# Patient Record
Sex: Female | Born: 1980
Health system: Southern US, Community
[De-identification: ages and names within clinical notes are randomized; demographics above are authoritative.]

## PROBLEM LIST (undated history)

## (undated) DIAGNOSIS — R079 Chest pain, unspecified: Secondary | ICD-10-CM

## (undated) DIAGNOSIS — F419 Anxiety disorder, unspecified: Secondary | ICD-10-CM

## (undated) DIAGNOSIS — G43709 Chronic migraine without aura, not intractable, without status migrainosus: Secondary | ICD-10-CM

## (undated) DIAGNOSIS — Z973 Presence of spectacles and contact lenses: Secondary | ICD-10-CM

## (undated) DIAGNOSIS — Z8679 Personal history of other diseases of the circulatory system: Secondary | ICD-10-CM

## (undated) DIAGNOSIS — R519 Headache, unspecified: Secondary | ICD-10-CM

## (undated) DIAGNOSIS — M25569 Pain in unspecified knee: Secondary | ICD-10-CM

## (undated) DIAGNOSIS — IMO0002 Reserved for concepts with insufficient information to code with codable children: Secondary | ICD-10-CM

## (undated) DIAGNOSIS — G479 Sleep disorder, unspecified: Secondary | ICD-10-CM

## (undated) DIAGNOSIS — F32A Depression, unspecified: Secondary | ICD-10-CM

## (undated) DIAGNOSIS — N3010 Interstitial cystitis (chronic) without hematuria: Secondary | ICD-10-CM

## (undated) DIAGNOSIS — M549 Dorsalgia, unspecified: Secondary | ICD-10-CM

## (undated) DIAGNOSIS — R0602 Shortness of breath: Secondary | ICD-10-CM

## (undated) DIAGNOSIS — R51 Headache: Secondary | ICD-10-CM

## (undated) DIAGNOSIS — Z87442 Personal history of urinary calculi: Secondary | ICD-10-CM

## (undated) DIAGNOSIS — N939 Abnormal uterine and vaginal bleeding, unspecified: Secondary | ICD-10-CM

## (undated) DIAGNOSIS — K589 Irritable bowel syndrome without diarrhea: Secondary | ICD-10-CM

## (undated) DIAGNOSIS — K5900 Constipation, unspecified: Secondary | ICD-10-CM

## (undated) DIAGNOSIS — F329 Major depressive disorder, single episode, unspecified: Secondary | ICD-10-CM

## (undated) DIAGNOSIS — M7989 Other specified soft tissue disorders: Secondary | ICD-10-CM

## (undated) DIAGNOSIS — I10 Essential (primary) hypertension: Secondary | ICD-10-CM

## (undated) DIAGNOSIS — M436 Torticollis: Secondary | ICD-10-CM

## (undated) HISTORY — DX: Major depressive disorder, single episode, unspecified: F32.9

## (undated) HISTORY — DX: Interstitial cystitis (chronic) without hematuria: N30.10

## (undated) HISTORY — DX: Shortness of breath: R06.02

## (undated) HISTORY — DX: Sleep disorder, unspecified: G47.9

## (undated) HISTORY — DX: Constipation, unspecified: K59.00

## (undated) HISTORY — PX: BUNIONECTOMY: SHX129

## (undated) HISTORY — DX: Pain in unspecified knee: M25.569

## (undated) HISTORY — DX: Other specified soft tissue disorders: M79.89

## (undated) HISTORY — PX: AUGMENTATION MAMMAPLASTY: SUR837

## (undated) HISTORY — DX: Chest pain, unspecified: R07.9

## (undated) HISTORY — DX: Torticollis: M43.6

## (undated) HISTORY — DX: Anxiety disorder, unspecified: F41.9

## (undated) HISTORY — DX: Depression, unspecified: F32.A

## (undated) HISTORY — PX: BREAST SURGERY: SHX581

## (undated) HISTORY — DX: Dorsalgia, unspecified: M54.9

## (undated) HISTORY — DX: Irritable bowel syndrome without diarrhea: K58.9

## (undated) HISTORY — PX: WISDOM TOOTH EXTRACTION: SHX21

---

## 2000-07-16 ENCOUNTER — Emergency Department (HOSPITAL_COMMUNITY): Admission: EM | Admit: 2000-07-16 | Discharge: 2000-07-16 | Payer: Self-pay | Admitting: Emergency Medicine

## 2002-05-22 ENCOUNTER — Inpatient Hospital Stay (HOSPITAL_COMMUNITY): Admission: AD | Admit: 2002-05-22 | Discharge: 2002-05-25 | Payer: Self-pay | Admitting: *Deleted

## 2003-07-10 ENCOUNTER — Other Ambulatory Visit: Admission: RE | Admit: 2003-07-10 | Discharge: 2003-07-10 | Payer: Self-pay | Admitting: Obstetrics & Gynecology

## 2006-06-22 ENCOUNTER — Emergency Department (HOSPITAL_COMMUNITY): Admission: EM | Admit: 2006-06-22 | Discharge: 2006-06-22 | Payer: Self-pay | Admitting: Emergency Medicine

## 2006-08-24 ENCOUNTER — Emergency Department (HOSPITAL_COMMUNITY): Admission: EM | Admit: 2006-08-24 | Discharge: 2006-08-24 | Payer: Self-pay | Admitting: Emergency Medicine

## 2007-12-04 ENCOUNTER — Emergency Department (HOSPITAL_COMMUNITY): Admission: EM | Admit: 2007-12-04 | Discharge: 2007-12-05 | Payer: Self-pay | Admitting: Emergency Medicine

## 2007-12-05 ENCOUNTER — Other Ambulatory Visit: Admission: RE | Admit: 2007-12-05 | Discharge: 2007-12-05 | Payer: Self-pay | Admitting: Obstetrics and Gynecology

## 2007-12-22 ENCOUNTER — Emergency Department (HOSPITAL_COMMUNITY): Admission: EM | Admit: 2007-12-22 | Discharge: 2007-12-22 | Payer: Self-pay | Admitting: Family Medicine

## 2008-07-09 ENCOUNTER — Emergency Department (HOSPITAL_COMMUNITY): Admission: EM | Admit: 2008-07-09 | Discharge: 2008-07-09 | Payer: Self-pay | Admitting: Emergency Medicine

## 2010-10-12 HISTORY — PX: BREAST ENHANCEMENT SURGERY: SHX7

## 2011-07-03 LAB — I-STAT 8, (EC8 V) (CONVERTED LAB)
BUN: 12
Bicarbonate: 26.5 — ABNORMAL HIGH
Chloride: 104
HCT: 45
Hemoglobin: 15.3 — ABNORMAL HIGH
Operator id: 277751
Sodium: 139
pCO2, Ven: 47.3

## 2011-07-03 LAB — POCT CARDIAC MARKERS: Troponin i, poc: <0.05

## 2011-07-03 LAB — POCT I-STAT CREATININE: Creatinine, Ser: 1

## 2011-07-13 LAB — DIFFERENTIAL
Basophils Absolute: 0.1
Eosinophils Absolute: 0.1
Eosinophils Relative: 1
Lymphocytes Relative: 8 — ABNORMAL LOW
Monocytes Absolute: 0.9

## 2011-07-13 LAB — COMPREHENSIVE METABOLIC PANEL
ALT: 15
AST: 18
Alkaline Phosphatase: 64
CO2: 27
Calcium: 8.7
Chloride: 105
GFR calc non Af Amer: >60
Glucose, Bld: 84
Potassium: 3.4 — ABNORMAL LOW
Sodium: 138
Total Bilirubin: 0.9

## 2011-07-13 LAB — URINE MICROSCOPIC-ADD ON

## 2011-07-13 LAB — CBC
Platelets: 246
RBC: 4.85
WBC: 17.8 — ABNORMAL HIGH

## 2011-07-13 LAB — URINALYSIS, ROUTINE W REFLEX MICROSCOPIC
Bilirubin Urine: NEGATIVE
Ketones, ur: NEGATIVE
Nitrite: NEGATIVE
Specific Gravity, Urine: 1.03
pH: 5.5

## 2011-07-13 LAB — PREGNANCY, URINE

## 2011-10-13 HISTORY — PX: INTRAUTERINE DEVICE (IUD) INSERTION: SHX5877

## 2012-01-18 ENCOUNTER — Ambulatory Visit: Payer: Self-pay | Admitting: Obstetrics and Gynecology

## 2012-02-10 ENCOUNTER — Encounter: Payer: Self-pay | Admitting: Obstetrics and Gynecology

## 2013-04-13 ENCOUNTER — Encounter (HOSPITAL_COMMUNITY): Payer: Self-pay | Admitting: Emergency Medicine

## 2013-04-13 ENCOUNTER — Emergency Department (HOSPITAL_COMMUNITY)
Admission: EM | Admit: 2013-04-13 | Discharge: 2013-04-13 | Disposition: A | Discharge status: Home / Self Care | Payer: Medicaid Other | Source: Home / Self Care | Attending: Emergency Medicine | Admitting: Emergency Medicine

## 2013-04-13 DIAGNOSIS — R350 Frequency of micturition: Secondary | ICD-10-CM

## 2013-04-13 DIAGNOSIS — R8271 Bacteriuria: Secondary | ICD-10-CM

## 2013-04-13 DIAGNOSIS — R82998 Other abnormal findings in urine: Secondary | ICD-10-CM

## 2013-04-13 DIAGNOSIS — G4440 Drug-induced headache, not elsewhere classified, not intractable: Secondary | ICD-10-CM

## 2013-04-13 HISTORY — DX: Headache: R51

## 2013-04-13 HISTORY — DX: Headache, unspecified: R51.9

## 2013-04-13 LAB — POCT I-STAT, CHEM 8
Creatinine, Ser: 0.9 mg/dL (ref 0.50–1.10)
Glucose, Bld: 79 mg/dL (ref 70–99)
HCT: 45 % (ref 36.0–46.0)
Hemoglobin: 15.3 g/dL — ABNORMAL HIGH (ref 12.0–15.0)
Sodium: 143 mEq/L (ref 135–145)
TCO2: 25 mmol/L (ref 0–100)

## 2013-04-13 LAB — POCT URINALYSIS DIP (DEVICE)
Glucose, UA: NEGATIVE mg/dL
Nitrite: NEGATIVE
Protein, ur: NEGATIVE mg/dL
Specific Gravity, Urine: >=1.03 (ref 1.005–1.030)
Urobilinogen, UA: 0.2 mg/dL (ref 0.0–1.0)

## 2013-04-13 LAB — POCT PREGNANCY, URINE: Preg Test, Ur: NEGATIVE

## 2013-04-13 MED ORDER — CIPROFLOXACIN HCL 250 MG PO TABS: 500.0000 mg | ORAL_TABLET | Freq: Two times a day (BID) | ORAL | Status: DC

## 2013-04-13 MED ORDER — CYCLOBENZAPRINE HCL 10 MG PO TABS: 10.0000 mg | ORAL_TABLET | Freq: Two times a day (BID) | ORAL | Status: DC | PRN

## 2013-04-13 MED ORDER — AMITRIPTYLINE HCL 25 MG PO TABS: 25.0000 mg | ORAL_TABLET | Freq: Every day | ORAL | Status: DC

## 2013-04-13 NOTE — ED Provider Notes (Signed)
History    CSN: [id number] Arrival date & time 04/13/13  1309  First MD Initiated Contact with Patient 04/13/13 1402     Chief Complaint  Patient presents with  . Headache   (Consider location/radiation/quality/duration/timing/severity/associated sxs/prior Treatment) HPI Comments: 32 year old patient presents complaining of headaches and frequency. Headaches have been going on for 2-3 years and the urinary frequency has been going on for about 6 months. She just recently got Medicaid and called her primary care provider, or health Department, to get an appointment. They would not be able to see her for some time and sent her here for treatment instead.   For the headaches, She has been taking 800 mg ibuprofen about 4-5 times per week along with an over-the-counter sleep aid. They're described as bilateral across the top of her head and are associated with nausea, photophobia, blurry vision. They have not changed significantly since they began a couple of years ago. Her mom suffered from migraines for many years that were similar to the headaches she is experiencing.  She describes the urinary frequency as feeling as if she has to urinate about every 5 minutes. She also states she gets up about 5 times per night to urinate. She tries to not drink any water in hopes of not having to urinate so frequently but this does not work. She denies burning, flank pain, abdominal pain.  She also complains of dry mouth  Patient is a 32 y.o. female presenting with headaches.  Headache Associated symptoms: no abdominal pain, no cough, no dizziness, no fever, no myalgias, no nausea and no vomiting    Past Medical History  Diagnosis Date  . Headache    Past Surgical History  Procedure Laterality Date  . Foot surgery    . Breast surgery     No family history on file. History  Substance Use Topics  . Smoking status: Never Smoker   . Smokeless tobacco: Not on file  . Alcohol Use: No   OB History    Grav Para Term Preterm Abortions TAB SAB Ect Mult Living                 Review of Systems  Constitutional: Negative for fever and chills.  Eyes: Negative for visual disturbance.  Respiratory: Negative for cough and shortness of breath.   Cardiovascular: Negative for chest pain, palpitations and leg swelling.  Gastrointestinal: Negative for nausea, vomiting and abdominal pain.  Endocrine: Negative for polydipsia and polyuria.  Genitourinary: Positive for frequency. Negative for dysuria, urgency, hematuria, flank pain, vaginal discharge and dyspareunia.  Musculoskeletal: Negative for myalgias and arthralgias.  Skin: Negative for rash.  Neurological: Positive for headaches. Negative for dizziness, weakness and light-headedness.    Allergies  Review of patient's allergies indicates no known allergies.  Home Medications   Current Outpatient Rx  Name  Route  Sig  Dispense  Refill  . OVER THE COUNTER MEDICATION      Taking an antibiotic for bv currently, cannot remember the name of drug         . amitriptyline (ELAVIL) 25 MG tablet   Oral   Take 1 tablet (25 mg total) by mouth at bedtime.   30 tablet   2   . ciprofloxacin (CIPRO) 250 MG tablet   Oral   Take 2 tablets (500 mg total) by mouth 2 (two) times daily.   20 tablet   0   . cyclobenzaprine (FLEXERIL) 10 MG tablet   Oral   Take  1 tablet (10 mg total) by mouth 2 (two) times daily as needed.   30 tablet   1    BP 120/67  Pulse 68  Temp(Src) 98.6 F (37 C) (Oral)  Resp 16  SpO2 100% Physical Exam  Nursing note and vitals reviewed. Constitutional: She is oriented to person, place, and time. Vital signs are normal. She appears well-developed and well-nourished. No distress.  HENT:  Head: Atraumatic.  Eyes: EOM are normal. Pupils are equal, round, and reactive to light.  Cardiovascular: Normal rate, regular rhythm and normal heart sounds.  Exam reveals no gallop and no friction rub.   No murmur  heard. Pulmonary/Chest: Effort normal and breath sounds normal. No respiratory distress. She has no wheezes. She has no rales.  Abdominal: Soft. There is no tenderness.  Neurological: She is alert and oriented to person, place, and time. She has normal strength. No cranial nerve deficit. Coordination normal.  Skin: Skin is warm and dry. She is not diaphoretic.  Psychiatric: She has a normal mood and affect. Her behavior is normal. Judgment normal.    ED Course  Procedures (including critical care time) Labs Reviewed  POCT URINALYSIS DIP (DEVICE) - Abnormal; Notable for the following:    Leukocytes, UA SMALL (*)    All other components within normal limits  POCT I-STAT, CHEM 8 - Abnormal; Notable for the following:    Potassium 3.4 (*)    Hemoglobin 15.3 (*)    All other components within normal limits  URINE CULTURE  POCT PREGNANCY, URINE   No results found. 1. Medication overuse headache   2. Urinary frequency   3. Bacteriuria     MDM  There is a small amount of bacteriuria but no definite urinary tract infection. Still, with the urinary frequency, will treat this as a UTI. I suspect the headache is a medication overuse headache. We'll treat as such and she is advised to avoid ibuprofen for headaches completely for the time being, and to followup with her PCP on this issue   Meds ordered this encounter  Medications  . OVER THE COUNTER MEDICATION    Sig: Taking an antibiotic for bv currently, cannot remember the name of drug  . amitriptyline (ELAVIL) 25 MG tablet    Sig: Take 1 tablet (25 mg total) by mouth at bedtime.    Dispense:  30 tablet    Refill:  2  . cyclobenzaprine (FLEXERIL) 10 MG tablet    Sig: Take 1 tablet (10 mg total) by mouth 2 (two) times daily as needed.    Dispense:  30 tablet    Refill:  1  . ciprofloxacin (CIPRO) 250 MG tablet    Sig: Take 2 tablets (500 mg total) by mouth 2 (two) times daily.    Dispense:  20 tablet    Refill:  0     Graylon Good, PA-C 04/13/13 1536

## 2013-04-13 NOTE — ED Provider Notes (Signed)
Medical screening examination/treatment/procedure(s) were performed by non-physician practitioner and as supervising physician I was immediately available for consultation/collaboration.  Madeleyn Schwimmer, M.D.  Arbor Cohen C Tyreshia Ingman, MD 04/13/13 1944 

## 2013-04-13 NOTE — ED Notes (Signed)
Patient reports health department on medicaid card-telephoned them and was told to come to urgent care for evaluation

## 2013-04-13 NOTE — ED Notes (Signed)
C/o headaches and frequent urination, no pain with urination.  Headaches have been an issue for 2-3 years.  Reports bladder issues for 6 months to a year.

## 2013-04-14 LAB — URINE CULTURE

## 2013-06-13 ENCOUNTER — Ambulatory Visit (INDEPENDENT_AMBULATORY_CARE_PROVIDER_SITE_OTHER): Payer: Medicaid Other | Admitting: Nurse Practitioner

## 2013-06-13 ENCOUNTER — Encounter: Payer: Self-pay | Admitting: Nurse Practitioner

## 2013-06-13 VITALS — BP 120/79 | HR 74 | Ht 64.0 in | Wt 142.0 lb

## 2013-06-13 DIAGNOSIS — G43009 Migraine without aura, not intractable, without status migrainosus: Secondary | ICD-10-CM

## 2013-06-13 DIAGNOSIS — F419 Anxiety disorder, unspecified: Secondary | ICD-10-CM | POA: Insufficient documentation

## 2013-06-13 DIAGNOSIS — F329 Major depressive disorder, single episode, unspecified: Secondary | ICD-10-CM | POA: Insufficient documentation

## 2013-06-13 DIAGNOSIS — G4700 Insomnia, unspecified: Secondary | ICD-10-CM | POA: Insufficient documentation

## 2013-06-13 DIAGNOSIS — F411 Generalized anxiety disorder: Secondary | ICD-10-CM

## 2013-06-13 DIAGNOSIS — G43709 Chronic migraine without aura, not intractable, without status migrainosus: Secondary | ICD-10-CM | POA: Insufficient documentation

## 2013-06-13 DIAGNOSIS — IMO0002 Reserved for concepts with insufficient information to code with codable children: Secondary | ICD-10-CM | POA: Insufficient documentation

## 2013-06-13 MED ORDER — VENLAFAXINE HCL ER 37.5 MG PO CP24: 37.5000 mg | ORAL_CAPSULE | Freq: Three times a day (TID) | ORAL | Status: DC

## 2013-06-13 MED ORDER — SUMATRIPTAN SUCCINATE 100 MG PO TABS: 100.0000 mg | ORAL_TABLET | ORAL | Status: DC | PRN

## 2013-06-13 MED ORDER — ZOLPIDEM TARTRATE 10 MG PO TABS: 10.0000 mg | ORAL_TABLET | Freq: Every evening | ORAL | Status: DC | PRN

## 2013-06-13 MED ORDER — PROMETHAZINE HCL 25 MG PO TABS: 25.0000 mg | ORAL_TABLET | Freq: Four times a day (QID) | ORAL | Status: DC | PRN

## 2013-06-13 NOTE — Patient Instructions (Signed)
Migraine Headache A migraine headache is an intense, throbbing pain on one or both sides of your head. A migraine can last for 30 minutes to several hours. CAUSES  The exact cause of a migraine headache is not always known. However, a migraine may be caused when nerves in the brain become irritated and release chemicals that cause inflammation. This causes pain. SYMPTOMS  Pain on one or both sides of your head.  Pulsating or throbbing pain.  Severe pain that prevents daily activities.  Pain that is aggravated by any physical activity.  Nausea, vomiting, or both.  Dizziness.  Pain with exposure to bright lights, loud noises, or activity.  General sensitivity to bright lights, loud noises, or smells. Before you get a migraine, you may get warning signs that a migraine is coming (aura). An aura may include:  Seeing flashing lights.  Seeing bright spots, halos, or zig-zag lines.  Having tunnel vision or blurred vision.  Having feelings of numbness or tingling.  Having trouble talking.  Having muscle weakness. MIGRAINE TRIGGERS  Alcohol.  Smoking.  Stress.  Menstruation.  Aged cheeses.  Foods or drinks that contain nitrates, glutamate, aspartame, or tyramine.  Lack of sleep.  Chocolate.  Caffeine.  Hunger.  Physical exertion.  Fatigue.  Medicines used to treat chest pain (nitroglycerine), birth control pills, estrogen, and some blood pressure medicines. DIAGNOSIS  A migraine headache is often diagnosed based on:  Symptoms.  Physical examination.  A CT scan or MRI of your head. TREATMENT Medicines may be given for pain and nausea. Medicines can also be given to help prevent recurrent migraines.  HOME CARE INSTRUCTIONS  Only take over-the-counter or prescription medicines for pain or discomfort as directed by your caregiver. The use of long-term narcotics is not recommended.  Lie down in a dark, quiet room when you have a migraine.  Keep a journal  to find out what may trigger your migraine headaches. For example, write down:  What you eat and drink.  How much sleep you get.  Any change to your diet or medicines.  Limit alcohol consumption.  Quit smoking if you smoke.  Get 7 to 9 hours of sleep, or as recommended by your caregiver.  Limit stress.  Keep lights dim if bright lights bother you and make your migraines worse. SEEK IMMEDIATE MEDICAL CARE IF:   Your migraine becomes severe.  You have a fever.  You have a stiff neck.  You have vision loss.  You have muscular weakness or loss of muscle control.  You start losing your balance or have trouble walking.  You feel faint or pass out.  You have severe symptoms that are different from your first symptoms. MAKE SURE YOU:   Understand these instructions.  Will watch your condition.  Will get help right away if you are not doing well or get worse. Document Released: 09/28/2005 Document Revised: 12/21/2011 Document Reviewed: 09/18/2011 [employer] Patient Information 2014 [employer].  

## 2013-06-13 NOTE — Progress Notes (Signed)
Diagnosis: Migraine without Aura, Chronic migraine  History: Christina Cox 32 y.o. is in office today for migraine consultation. She has had migraines for years. Her mother and Aunts have migraine. Migraines have gotten worse in last 1.5 years and now she is having daily migraine of some type. She has a history of depression going back to teenage years when both of her parents died and she went to live with friends. She has had 4 years of counseling. She also admits to anxiety and insomnia. She has difficulty falling asleep taking up to 2 hours and staying asleep, waking up to 5-6 times at night. She was recently diagnosed with Interstitial Cystitis and placed on Amytriptyline 25 mg. Socially she has been laid off 11 months ago and has not rejoined the work force due to migraines and bladder issues. She is a single mother who does not smoke, drink or do drugs.      Location: Right temple  Number of Headache days/month: Severe:12 Moderate: 8 Mild: 10  Current Outpatient Prescriptions on File Prior to Visit  Medication Sig Dispense Refill  . amitriptyline (ELAVIL) 25 MG tablet Take 1 tablet (25 mg total) by mouth at bedtime.  30 tablet  2  . ciprofloxacin (CIPRO) 250 MG tablet Take 2 tablets (500 mg total) by mouth 2 (two) times daily.  20 tablet  0  . cyclobenzaprine (FLEXERIL) 10 MG tablet Take 1 tablet (10 mg total) by mouth 2 (two) times daily as needed.  30 tablet  1  . OVER THE COUNTER MEDICATION Taking an antibiotic for bv currently, cannot remember the name of drug       No current facility-administered medications on file prior to visit.    Acute prevention: Amitriptyline/ NSAIDS  Past Medical History  Diagnosis Date  . Headache    Past Surgical History  Procedure Laterality Date  . Foot surgery    . Breast surgery     No family history on file. Social History:  reports that she has never smoked. She does not have any smokeless tobacco history on file. She reports that  she does not drink alcohol or use illicit drugs. Allergies: No Known Allergies  Triggers: Stress  Birth control: Mirenia IUD  ROS: Positive for migraine, nausea, vomiting, anxiety, depression, Interstitial Cystitis, insomnia  Exam: Well developed, well nourished caucasian female  General:  Has migraine today HEENT: Negative Cardiac:RRR Lungs:Clear Neuro:Negative Skin:Warm and dry  Procedure: 2cc lidocaine, 2 cc marcaine, 1 cc dexamethazone. Injected around the right temple area. Pt tolerated procedure well. Advised to ice.   Impression:migraine - common  Plan: Discussed the pathophysiology of migraine and medications. She will wait for counseling again unless necessary. Start Effexor tapering up, use Imitrex, motrin, phenergan when she gets a migraine. Ambien for sleep. Trigger point injections to bridge her from daily migraine to episodic.    Time Spent: 45 minutes

## 2013-06-19 ENCOUNTER — Ambulatory Visit: Payer: Medicaid Other | Attending: Urology | Admitting: Physical Therapy

## 2013-07-18 ENCOUNTER — Encounter: Payer: Medicaid Other | Admitting: Nurse Practitioner

## 2013-09-18 ENCOUNTER — Other Ambulatory Visit: Payer: Self-pay | Admitting: Urology

## 2013-10-03 ENCOUNTER — Encounter (HOSPITAL_BASED_OUTPATIENT_CLINIC_OR_DEPARTMENT_OTHER): Payer: Self-pay | Admitting: *Deleted

## 2013-10-03 NOTE — Progress Notes (Signed)
Npo after mn. Arrive at 0600. Needs hg and urine preg.

## 2013-10-10 ENCOUNTER — Encounter (HOSPITAL_BASED_OUTPATIENT_CLINIC_OR_DEPARTMENT_OTHER): Payer: Self-pay | Admitting: *Deleted

## 2013-10-13 ENCOUNTER — Encounter (HOSPITAL_COMMUNITY)
Admission: RE | Disposition: A | Discharge status: Home / Self Care | Payer: Self-pay | Source: Ambulatory Visit | Attending: Urology

## 2013-10-13 ENCOUNTER — Ambulatory Visit (HOSPITAL_COMMUNITY)
Admission: RE | Admit: 2013-10-13 | Discharge: 2013-10-13 | Disposition: A | Discharge status: Home / Self Care | Payer: Medicaid Other | Source: Ambulatory Visit | Attending: Urology | Admitting: Urology

## 2013-10-13 ENCOUNTER — Encounter (HOSPITAL_COMMUNITY): Payer: Medicaid Other | Admitting: *Deleted

## 2013-10-13 ENCOUNTER — Ambulatory Visit (HOSPITAL_COMMUNITY): Payer: Medicaid Other | Admitting: *Deleted

## 2013-10-13 ENCOUNTER — Encounter (HOSPITAL_COMMUNITY): Payer: Self-pay | Admitting: *Deleted

## 2013-10-13 DIAGNOSIS — G8929 Other chronic pain: Secondary | ICD-10-CM | POA: Insufficient documentation

## 2013-10-13 DIAGNOSIS — Z87891 Personal history of nicotine dependence: Secondary | ICD-10-CM | POA: Insufficient documentation

## 2013-10-13 DIAGNOSIS — N3289 Other specified disorders of bladder: Secondary | ICD-10-CM | POA: Insufficient documentation

## 2013-10-13 DIAGNOSIS — N949 Unspecified condition associated with female genital organs and menstrual cycle: Secondary | ICD-10-CM | POA: Insufficient documentation

## 2013-10-13 DIAGNOSIS — R351 Nocturia: Secondary | ICD-10-CM | POA: Insufficient documentation

## 2013-10-13 DIAGNOSIS — Z975 Presence of (intrauterine) contraceptive device: Secondary | ICD-10-CM | POA: Insufficient documentation

## 2013-10-13 DIAGNOSIS — R102 Pelvic and perineal pain: Secondary | ICD-10-CM

## 2013-10-13 DIAGNOSIS — R350 Frequency of micturition: Secondary | ICD-10-CM | POA: Insufficient documentation

## 2013-10-13 DIAGNOSIS — G43909 Migraine, unspecified, not intractable, without status migrainosus: Secondary | ICD-10-CM | POA: Insufficient documentation

## 2013-10-13 HISTORY — DX: Chronic migraine without aura, not intractable, without status migrainosus: G43.709

## 2013-10-13 HISTORY — PX: CYSTO WITH HYDRODISTENSION: SHX5453

## 2013-10-13 HISTORY — DX: Presence of spectacles and contact lenses: Z97.3

## 2013-10-13 HISTORY — DX: Reserved for concepts with insufficient information to code with codable children: IMO0002

## 2013-10-13 LAB — CBC
HCT: 42.7 % (ref 36.0–46.0)
Hemoglobin: 14.4 g/dL (ref 12.0–15.0)
MCH: 30.6 pg (ref 26.0–34.0)
MCHC: 33.7 g/dL (ref 30.0–36.0)
MCV: 90.7 fL (ref 78.0–100.0)
Platelets: 243 10*3/uL (ref 150–400)
RBC: 4.71 MIL/uL (ref 3.87–5.11)
RDW: 12.4 % (ref 11.5–15.5)
WBC: 4.3 10*3/uL (ref 4.0–10.5)

## 2013-10-13 LAB — HCG, SERUM, QUALITATIVE: Preg, Serum: NEGATIVE

## 2013-10-13 SURGERY — CYSTOSCOPY, WITH BLADDER HYDRODISTENSION
Anesthesia: General

## 2013-10-13 SURGERY — CYSTOSCOPY, WITH BLADDER HYDRODISTENSION
Anesthesia: General | Site: Bladder

## 2013-10-13 MED ORDER — PROMETHAZINE HCL 25 MG/ML IJ SOLN: 6.2500 mg | INTRAMUSCULAR | Status: DC | PRN

## 2013-10-13 MED ORDER — LIDOCAINE HCL 1 % IJ SOLN
INTRAMUSCULAR | Status: DC | PRN
  Administered 2013-10-13: 60 mg via INTRADERMAL

## 2013-10-13 MED ORDER — FENTANYL CITRATE 0.05 MG/ML IJ SOLN
INTRAMUSCULAR | Status: AC
  Filled 2013-10-13: qty 4

## 2013-10-13 MED ORDER — DIPHENHYDRAMINE HCL 50 MG/ML IJ SOLN
INTRAMUSCULAR | Status: DC | PRN
  Administered 2013-10-13: 12.5 mg via INTRAVENOUS

## 2013-10-13 MED ORDER — MEPERIDINE HCL 50 MG/ML IJ SOLN: 6.2500 mg | INTRAMUSCULAR | Status: DC | PRN

## 2013-10-13 MED ORDER — HYDROMORPHONE HCL PF 1 MG/ML IJ SOLN
0.2500 mg | INTRAMUSCULAR | Status: DC | PRN
  Administered 2013-10-13: 0.5 mg via INTRAVENOUS
  Administered 2013-10-13 (×2): 0.25 mg via INTRAVENOUS

## 2013-10-13 MED ORDER — LIDOCAINE HCL (CARDIAC) 20 MG/ML IV SOLN
INTRAVENOUS | Status: AC
  Filled 2013-10-13: qty 5

## 2013-10-13 MED ORDER — DEXAMETHASONE SODIUM PHOSPHATE 10 MG/ML IJ SOLN
INTRAMUSCULAR | Status: AC
  Filled 2013-10-13: qty 1

## 2013-10-13 MED ORDER — ONDANSETRON HCL 4 MG/2ML IJ SOLN
INTRAMUSCULAR | Status: DC | PRN
  Administered 2013-10-13: 4 mg via INTRAVENOUS

## 2013-10-13 MED ORDER — DEXAMETHASONE SODIUM PHOSPHATE 10 MG/ML IJ SOLN
INTRAMUSCULAR | Status: DC | PRN
  Administered 2013-10-13: 5 mg via INTRAVENOUS

## 2013-10-13 MED ORDER — PROPOFOL 10 MG/ML IV BOLUS
INTRAVENOUS | Status: AC
  Filled 2013-10-13: qty 20

## 2013-10-13 MED ORDER — CIPROFLOXACIN IN D5W 400 MG/200ML IV SOLN
INTRAVENOUS | Status: AC
  Filled 2013-10-13: qty 200

## 2013-10-13 MED ORDER — DIPHENHYDRAMINE HCL 50 MG/ML IJ SOLN
INTRAMUSCULAR | Status: AC
  Filled 2013-10-13: qty 1

## 2013-10-13 MED ORDER — MIDAZOLAM HCL 5 MG/5ML IJ SOLN
INTRAMUSCULAR | Status: DC | PRN
  Administered 2013-10-13: 2 mg via INTRAVENOUS

## 2013-10-13 MED ORDER — FENTANYL CITRATE 0.05 MG/ML IJ SOLN
INTRAMUSCULAR | Status: DC | PRN
  Administered 2013-10-13: 50 ug via INTRAVENOUS
  Administered 2013-10-13: 100 ug via INTRAVENOUS
  Administered 2013-10-13: 50 ug via INTRAVENOUS

## 2013-10-13 MED ORDER — BELLADONNA ALKALOIDS-OPIUM 16.2-60 MG RE SUPP
RECTAL | Status: AC
  Filled 2013-10-13: qty 1

## 2013-10-13 MED ORDER — LACTATED RINGERS IV SOLN
INTRAVENOUS | Status: DC | PRN
  Administered 2013-10-13: 07:00:00 via INTRAVENOUS

## 2013-10-13 MED ORDER — MIDAZOLAM HCL 2 MG/2ML IJ SOLN
INTRAMUSCULAR | Status: AC
  Filled 2013-10-13: qty 2

## 2013-10-13 MED ORDER — CIPROFLOXACIN IN D5W 400 MG/200ML IV SOLN
400.0000 mg | INTRAVENOUS | Status: AC
  Administered 2013-10-13: 400 mg via INTRAVENOUS

## 2013-10-13 MED ORDER — STERILE WATER FOR IRRIGATION IR SOLN
Status: DC | PRN
  Administered 2013-10-13: 3000 mL
  Administered 2013-10-13: 1000 mL

## 2013-10-13 MED ORDER — OXYCODONE HCL 5 MG PO TABS: 5.0000 mg | ORAL_TABLET | ORAL | Status: DC | PRN

## 2013-10-13 MED ORDER — HYDROMORPHONE HCL PF 1 MG/ML IJ SOLN
INTRAMUSCULAR | Status: AC
  Filled 2013-10-13: qty 1

## 2013-10-13 MED ORDER — BELLADONNA ALKALOIDS-OPIUM 16.2-60 MG RE SUPP
RECTAL | Status: DC | PRN
  Administered 2013-10-13: 1 via RECTAL

## 2013-10-13 MED ORDER — OXYCODONE HCL 5 MG/5ML PO SOLN
5.0000 mg | Freq: Once | ORAL | Status: AC | PRN
  Filled 2013-10-13: qty 5

## 2013-10-13 MED ORDER — OXYCODONE HCL 5 MG PO TABS
5.0000 mg | ORAL_TABLET | Freq: Once | ORAL | Status: AC | PRN
  Administered 2013-10-13: 5 mg via ORAL
  Filled 2013-10-13: qty 1

## 2013-10-13 MED ORDER — METOCLOPRAMIDE HCL 5 MG/ML IJ SOLN
INTRAMUSCULAR | Status: DC | PRN
  Administered 2013-10-13: 10 mg via INTRAVENOUS

## 2013-10-13 MED ORDER — PROPOFOL 10 MG/ML IV BOLUS
INTRAVENOUS | Status: DC | PRN
  Administered 2013-10-13: 20 mg via INTRAVENOUS
  Administered 2013-10-13: 170 mg via INTRAVENOUS

## 2013-10-13 MED ORDER — ONDANSETRON HCL 4 MG/2ML IJ SOLN
INTRAMUSCULAR | Status: AC
  Filled 2013-10-13: qty 2

## 2013-10-13 SURGICAL SUPPLY — 13 items
BAG URO CATCHER STRL LF (DRAPE) ×3 IMPLANT
CATH ROBINSON RED A/P 16FR (CATHETERS) IMPLANT
DRAPE CAMERA CLOSED 9X96 (DRAPES) ×3 IMPLANT
GLOVE BIOGEL M STRL SZ7.5 (GLOVE) ×3 IMPLANT
GOWN PREVENTION PLUS LG XLONG (DISPOSABLE) ×6 IMPLANT
MANIFOLD NEPTUNE II (INSTRUMENTS) ×3 IMPLANT
NDL SAFETY ECLIPSE 18X1.5 (NEEDLE) IMPLANT
NEEDLE HYPO 18GX1.5 SHARP (NEEDLE)
NEEDLE HYPO 22GX1.5 SAFETY (NEEDLE) IMPLANT
PACK CYSTO (CUSTOM PROCEDURE TRAY) ×3 IMPLANT
TUBING CONNECTING 10 (TUBING) IMPLANT
TUBING CONNECTING 10' (TUBING)
WATER STERILE IRR 3000ML UROMA (IV SOLUTION) ×3 IMPLANT

## 2013-10-13 NOTE — Progress Notes (Signed)
PACU redness resolved.

## 2013-10-13 NOTE — Progress Notes (Signed)
PACU On arrival to PACU face,neck, shoulders bright red. Meds given per [place].

## 2013-10-13 NOTE — Anesthesia Postprocedure Evaluation (Signed)
Anesthesia Post Note  Patient: Christina Cox  Procedure(s) Performed: Procedure(s) (LRB): CYSTOSCOPY/HYDRODISTENSION (N/A)  Anesthesia type: General  Patient location: PACU  Post pain: Pain level controlled  Post assessment: Post-op Vital signs reviewed  Last Vitals: BP 121/83  Pulse 75  Temp(Src) 36.4 C (Oral)  Resp 14  Ht 5\' 5"  (1.651 m)  Wt 130 lb (58.968 kg)  BMI 21.63 kg/m2  SpO2 100%  Post vital signs: Reviewed  Level of consciousness: sedated  Complications: No apparent anesthesia complications

## 2013-10-13 NOTE — H&P (Addendum)
Reason For Visit Follow-up for pelvic floor dysfunction and pelvic pain.   History of Present Illness GU History:   [facility]    79F who presents with symptoms of urinary frequency x 1 year. Symptoms are worse at night, she reports having to get up 5-6x/night. She denies any incontinence. She was recently treated with abx which did not improve her symptoms. In addition she was tested for STDs which were negative. She has been recently treated for BV. She has also recently had an IUD placed.    Over the last 4 months the patient has developed significant bladder pain and bladder pressure. Patient states that she is voiding every 20 minutes. She denies incontinence or dysuria. States that she is having intense bladder pain and pressure. She gets no relief when she voids. She has not noted any hematuria. She denies any flank pain. She denies pain with intercourse.    The patient states that over the past several months she had significant stress in her life although did not give details and I did not ask her.    Patient drinks 3 lipton ice teas per day and 2 glasses of juice. Although she prefers to drink water she feels water makes her symptoms worse. She denies any alcohol or coffee. She does not drink sodas.    Patient states that she eats mostly salads without dressing and wrapped. She does not eat a lot of fruit or not.    10/13: Pain is not well controlled currently. She continues to have this hernia, and dysuria, suprapubic pain and nocturia. She voids every 15 minutes at night. During the day she is able to this the urge and some of the pain. She denies any fevers. She is not taking amitriptyline, because her primary care doctor started her on imipramine for her migraine headaches. She has been working on modifying her stress. She continues to have constipation. She denies any neurological changes. For insurance  purposes she was unable to go to pelvic floor physical therapy to date.    Interval: Patient has purchased her own set of vasodilators and has been doing this every 2 weeks. She states that the pain is actually significantly improved and that she no longer has dyspareunia. In addition the patient has been using the Valium intravaginal at night with significant improvement in her nocturia and pelvic pain. She still complaining of pelvic pressure and urinary frequency during the day. She states that her pain is worse in the afternoon. In addition, the patient performed a MiraLAX bowel prep as instructed and had significant success. She now takes MiraLAX daily and has daily bowel movements.   Past Medical History Problems  1. History of Anxiety (300.00) 2. History of depression (V11.8)  Surgical History Problems  1. History of Breast Surgery Enlargement Procedure 2. History of Foot Surgery  Current Meds 1. Diazepam 5 MG Oral Tablet; TAKE 1 TABLET Bedtime;  Therapy: 20Oct2014 to (Last Rx:01Dec2014) Ordered  Allergies Medication  1. No Known Drug Allergies  Family History Problems  1. Family history of No Significant Family History  Social History Problems  1. Former smoker (V15.82)   [id number] year quit in 2009 2. Marital History - Single  Review of Systems Daily bowel movements, no neurological changes, improvement in her lower urinary tract symptoms.    Vitals Vital Signs [Data Includes: Last 1 Day]  Recorded: 08Dec2014 11:39AM  Blood Pressure: 113 / 75 Temperature: 97.6  F Heart Rate: 86  Results/Data Urine [Data Includes: Last 1 Day]   08Dec2014  COLOR YELLOW   APPEARANCE CLEAR   SPECIFIC GRAVITY 1.020   pH 5.5   GLUCOSE NEG mg/dL  BILIRUBIN NEG   KETONE NEG mg/dL  BLOOD LARGE   PROTEIN NEG mg/dL  UROBILINOGEN 0.2 mg/dL  NITRITE NEG   LEUKOCYTE ESTERASE NEG   SQUAMOUS EPITHELIAL/HPF FEW   WBC 0-2 WBC/hpf  RBC TNTC RBC/hpf  BACTERIA MODERATE   CRYSTALS  NONE SEEN   CASTS NONE SEEN    PVR: 1  Ultrasound PVR 0 ml1 .     1 Amended By: Berniece Salines; Sep 18 2013 12:52 PM EST  Assessment Assessed  1. Chronic interstitial cystitis without hematuria (595.1) 2. Increased urinary frequency (788.41)  Plan Chronic interstitial cystitis without hematuria  1. Follow-up Schedule Surgery Office  Follow-up  Status: Complete  Done: 08Dec2014 Health Maintenance  2. UA With REFLEX; Status:Complete;   Done: 08Dec2014 11:29AM  Discussion/Summary Patient has made significant progress with her vaginal dilators and the 5 mg of Valium daily at bedtime. Her constipation is also significantly improved on MiraLAX daily. She still has bladder pain/pressure and urinary frequency during the daytime. We discussed the utility of an anticholinergic medication. The patient tells me that she already struggles with dry mouth, dry eyes, and constipation. I do not think her insurance company will plan for myrbetriq without trying anticholinergics first. As such, I have given her a month supply of Vesicare 10 mg daily. We discussed the side effects of the medication and ways to counteract them.  We also discussed the role of hydrodistention. I went over this procedure with the patient in detail including the risks and benefits. She understands that this may not help her symptoms of bladder pressure/pain and frequency. She also understands that following the operation she may have increased pain. Having details of the operation and the risk and benefits, the patient would like to proceed with cystoscopy and hydrodistention. We will get her set up at her earliest convenience. We'll schedule for the patient to return in 6 weeks after her hydro-distention.       Addendum: No changes to above.\  RRR CTA-B  Proceed with Hydrodistention.

## 2013-10-13 NOTE — Transfer of Care (Signed)
Immediate Anesthesia Transfer of Care Note  Patient: Christina Cox  Procedure(s) Performed: Procedure(s): CYSTOSCOPY/HYDRODISTENSION (N/A)  Patient Location: PACU  Anesthesia Type:General  Level of Consciousness: oriented and patient cooperative  Airway & Oxygen Therapy: Patient Spontanous Breathing  Post-op Assessment: Report given to PACU RN, Post -op Vital signs reviewed and stable and Patient moving all extremities  Post vital signs: Reviewed and stable  Complications: No apparent anesthesia complications

## 2013-10-13 NOTE — Anesthesia Preprocedure Evaluation (Signed)
Anesthesia Evaluation  Patient identified by MRN, date of birth, ID band Patient awake    Reviewed: Allergy & Precautions, H&P , NPO status , Patient's Chart, lab work & pertinent test results  Airway Mallampati: I TM Distance: >3 FB Neck ROM: Full    Dental  (+) Teeth Intact and Dental Advisory Given   Pulmonary neg pulmonary ROS, former smoker,  breath sounds clear to auscultation        Cardiovascular negative cardio ROS  Rhythm:Regular Rate:Normal     Neuro/Psych  Headaches, PSYCHIATRIC DISORDERS Anxiety Depression negative neurological ROS     GI/Hepatic negative GI ROS, Neg liver ROS,   Endo/Other  negative endocrine ROS  Renal/GU negative Renal ROS     Musculoskeletal negative musculoskeletal ROS (+)   Abdominal   Peds negative pediatric ROS (+)  Hematology negative hematology ROS (+)   Anesthesia Other Findings   Reproductive/Obstetrics negative OB ROS                           Anesthesia Physical Anesthesia Plan  ASA: I  Anesthesia Plan: General   Post-op Pain Management:    Induction: Intravenous  Airway Management Planned:   Additional Equipment:   Intra-op Plan:   Post-operative Plan: Extubation in OR  Informed Consent: I have reviewed the patients History and Physical, chart, labs and discussed the procedure including the risks, benefits and alternatives for the proposed anesthesia with the patient or authorized representative who has indicated his/her understanding and acceptance.   Dental advisory given  Plan Discussed with: CRNA  Anesthesia Plan Comments:         Anesthesia Quick Evaluation

## 2013-10-13 NOTE — Discharge Instructions (Signed)
Cystoscopy Cystoscopy is a procedure that is used to help your caregiver diagnose and sometimes treat conditions that affect your lower urinary tract. Your lower urinary tract includes your bladder and the tube through which urine passes from your bladder out of your body (urethra). Cystoscopy is performed with a thin, tube-shaped instrument (cystoscope). The cystoscope has lenses and a light at the end so that your caregiver can see inside your bladder. The cystoscope is inserted at the entrance of your urethra. Your caregiver guides it through your urethra and into your bladder. There are two main types of cystoscopy:  Flexible cystoscopy (with a flexible cystoscope).  Rigid cystoscopy (with a rigid cystoscope). Cystoscopy may be recommended for many conditions, including:  Urinary tract infections.  Blood in your urine (hematuria).  Loss of bladder control (urinary incontinence) or overactive bladder.  Unusual cells found in a urine sample.  Urinary blockage.  Painful urination. Cystoscopy may also be done to remove a sample of your tissue to be checked under a microscope (biopsy). It may also be done to remove or destroy bladder stones. LET YOUR CAREGIVER KNOW ABOUT:  Allergies to food or medicine.  Medicines taken, including vitamins, herbs, eyedrops, over-the-counter medicines, and creams.  Use of steroids (by mouth or creams).  Previous problems with anesthetics or numbing medicines.  History of bleeding problems or blood clots.  Previous surgery.  Other health problems, including diabetes and kidney problems.  Possibility of pregnancy, if this applies. PROCEDURE The area around the opening to your urethra will be cleaned. A medicine to numb your urethra (local anesthetic) is used. If a tissue sample or stone is removed during the procedure, you may be given a medicine to make you sleep (general anesthetic). Your caregiver will gently insert the tip of the cystoscope  into your urethra. The cystoscope will be slowly glided through your urethra and into your bladder. Sterile fluid will flow through the cystoscope and into your bladder. The fluid will expand and stretch your bladder. This gives your caregiver a better view of your bladder walls. The procedure lasts about 15 20 minutes. AFTER THE PROCEDURE If a local anesthetic is used, you will be allowed to go home as soon as you are ready. If a general anesthetic is used, you will be taken to a recovery area until you are stable. You may have temporary bleeding and burning on urination.

## 2013-10-13 NOTE — Op Note (Signed)
Preoperative diagnosis:  1. Chronic pelvic pain   Postoperative diagnosis:  1. As above   Procedure: 1. Cystoscopy and hydrodistention  Surgeon: Crist FatBenjamin W. Omare Bilotta, MD  Anesthesia: General  Complications: None  Intraoperative findings: The urethra was narrow and I was unable to get a 22 [place] cystoscope in. I was able to easily pass a 17 French cystoscopic sheath. The bladder was distended to 800 cc for the  irrigation stopped dripping which was hung at 80 cm H2O. There were no nor ulcerations or glomerulations noted. There was some increased neovascularity. There was some hemorrhage from the trigonal region at the end of the case.  EBL: Minimal  Specimens: None  Indication: Christina Cox is a 33 y.o. patient with chronic pelvic pain. She has tried physical therapy, Anti-cholinergics, anti-inflammatories and was eager to proceed with hydrodistention.  After reviewing the management options for treatment, he elected to proceed with the above surgical procedure(s). We have discussed the potential benefits and risks of the procedure, side effects of the proposed treatment, the likelihood of the patient achieving the goals of the procedure, and any potential problems that might occur during the procedure or recuperation. Informed consent has been obtained.  Description of procedure:  The patient was taken to the operating room and general anesthesia was induced.  The patient was placed in the dorsal lithotomy position, prepped and draped in the usual sterile fashion, and preoperative antibiotics were administered. A preoperative time-out was performed.   The irrigant was measured at 80 cm of water off of the patient. I then attempted to place a 22 French rigid cystoscope with a 30 lens into the patient's urethra was unable to get beyond the urethral meatus. We then exchanged the 22 French sheath for a 17 French sheath which was easily inserted. A 360 cystoscopic evaluation was then  performed with no significant abnormalities. The bladder was then emptied and filled via gravity had 80 cm of water to 800 cc. Once the irrigant stopped, the bladder was left distended for 5 minutes. It was then slowly emptied. Was then slowly filled again and there was notable bleeding from the trigonal area. There is no evidence of perforation or cracking of the bladder mucosa.  A BNO suppository was inserted into the patient after the bladder was emptied. She subsequently awoken and returned back in excellent condition.  Crist FatBenjamin W. Darragh Nay, M.D.

## 2013-10-16 ENCOUNTER — Encounter (HOSPITAL_COMMUNITY): Payer: Self-pay | Admitting: Urology

## 2013-11-30 ENCOUNTER — Emergency Department (HOSPITAL_BASED_OUTPATIENT_CLINIC_OR_DEPARTMENT_OTHER)
Admission: EM | Admit: 2013-11-30 | Discharge: 2013-11-30 | Disposition: A | Discharge status: Home / Self Care | Payer: Medicaid Other | Attending: Emergency Medicine | Admitting: Emergency Medicine

## 2013-11-30 ENCOUNTER — Encounter (HOSPITAL_BASED_OUTPATIENT_CLINIC_OR_DEPARTMENT_OTHER): Payer: Self-pay | Admitting: Emergency Medicine

## 2013-11-30 DIAGNOSIS — F3289 Other specified depressive episodes: Secondary | ICD-10-CM | POA: Insufficient documentation

## 2013-11-30 DIAGNOSIS — Y9229 Other specified public building as the place of occurrence of the external cause: Secondary | ICD-10-CM | POA: Insufficient documentation

## 2013-11-30 DIAGNOSIS — F329 Major depressive disorder, single episode, unspecified: Secondary | ICD-10-CM | POA: Insufficient documentation

## 2013-11-30 DIAGNOSIS — G43709 Chronic migraine without aura, not intractable, without status migrainosus: Secondary | ICD-10-CM | POA: Insufficient documentation

## 2013-11-30 DIAGNOSIS — R21 Rash and other nonspecific skin eruption: Secondary | ICD-10-CM | POA: Insufficient documentation

## 2013-11-30 DIAGNOSIS — T2005XA Burn of unspecified degree of scalp [any part], initial encounter: Secondary | ICD-10-CM | POA: Insufficient documentation

## 2013-11-30 DIAGNOSIS — Y939 Activity, unspecified: Secondary | ICD-10-CM | POA: Insufficient documentation

## 2013-11-30 DIAGNOSIS — T2045XA Corrosion of unspecified degree of scalp [any part], initial encounter: Secondary | ICD-10-CM

## 2013-11-30 DIAGNOSIS — Z87891 Personal history of nicotine dependence: Secondary | ICD-10-CM | POA: Insufficient documentation

## 2013-11-30 DIAGNOSIS — Z87448 Personal history of other diseases of urinary system: Secondary | ICD-10-CM | POA: Insufficient documentation

## 2013-11-30 DIAGNOSIS — IMO0002 Reserved for concepts with insufficient information to code with codable children: Secondary | ICD-10-CM | POA: Insufficient documentation

## 2013-11-30 MED ORDER — HYDROCODONE-ACETAMINOPHEN 5-325 MG PO TABS
0.5000 | ORAL_TABLET | Freq: Four times a day (QID) | ORAL | Status: DC | PRN
Start: 2013-11-30 — End: 2017-10-20

## 2013-11-30 NOTE — ED Provider Notes (Signed)
CSN: [id number]     Arrival date & time 11/30/13  1057 History   First MD Initiated Contact with Patient 11/30/13 1141     Chief Complaint  Patient presents with  . Burn     (Consider location/radiation/quality/duration/timing/severity/associated sxs/prior Treatment) Patient is a 33 y.o. female presenting with burn. The history is provided by the patient.  Burn Burn location:  Head/neck (pt had her hair dyed on monday and felt like her scalp was burning and since that time has had burns to the scalp and forehead) Head/neck burn location:  Scalp Burn quality:  Painful, ruptured blister and red Time since incident:  4 days Progression:  Worsening (pain is worsening but rash has improved) Pain details:    Severity:  Moderate   Duration:  4 days   Timing:  Constant   Progression:  Worsening Mechanism of burn:  Chemical Incident location: hair salon. Relieved by:  Nothing Worsened by:  Scratching and tactile pressure Ineffective treatments:  Acetaminophen Tetanus status:  Up to date   Past Medical History  Diagnosis Date  . Interstitial cystitis   . Depression   . Chronic migraine   . Wears glasses    Past Surgical History  Procedure Laterality Date  . Intrauterine device (iud) insertion  2013    mirena  . Bunionectomy Right age 33  . Breast enhancement surgery Bilateral 2012  . Wisdom tooth extraction  age 33  . Cysto with hydrodistension N/A 10/13/2013    Procedure: CYSTOSCOPY/HYDRODISTENSION;  Surgeon: Crist FatBenjamin W Herrick, MD;  Location: [facility];  Service: Urology;  Laterality: N/A;   Family History  Problem Relation Age of Onset  . Migraines Mother   . Alcohol abuse Mother   . Depression Mother   . Mental illness Mother   . Hypertension Father   . Hyperlipidemia Father   . Depression Maternal Aunt   . Depression Maternal Grandmother   . Cancer Maternal Grandmother     lung cancer-smoker   History  Substance Use Topics  . Smoking status: Former Games developermoker  .  Smokeless tobacco: Never Used  . Alcohol Use: No   OB History   Grav Para Term Preterm Abortions TAB SAB Ect Mult Living                 Review of Systems  All other systems reviewed and are negative.      Allergies  Review of patient's allergies indicates no known allergies.  Home Medications   Current Outpatient Rx  Name  Route  Sig  Dispense  Refill  . diazepam (VALIUM) 5 MG tablet   Oral   Take 5 mg by mouth every 6 (six) hours as needed for anxiety.         Marland Kitchen. HYDROcodone-acetaminophen (NORCO/VICODIN) 5-325 MG per tablet   Oral   Take 0.5-1 tablets by mouth every 6 (six) hours as needed for severe pain.   10 tablet   0   . imipramine (TOFRANIL) 25 MG tablet   Oral   Take 50 mg by mouth at bedtime.         Marland Kitchen. levonorgestrel (MIRENA) 20 MCG/24HR IUD   Intrauterine   1 each by Intrauterine route once.         . nabumetone (RELAFEN) 500 MG tablet   Oral   Take 500 mg by mouth 2 (two) times daily as needed.         Marland Kitchen. oxyCODONE (ROXICODONE) 5 MG immediate release tablet   Oral  Take 1 tablet (5 mg total) by mouth every 4 (four) hours as needed.   15 tablet   0   . topiramate (TOPAMAX) 25 MG capsule   Oral   Take 25 mg by mouth at bedtime.          BP 139/75  Pulse 82  Temp(Src) 98.2 F (36.8 C) (Oral)  Resp 16  Ht 5\' 5"  (1.651 m)  Wt 133 lb (60.328 kg)  BMI 22.13 kg/m2  SpO2 100% Physical Exam  Nursing note and vitals reviewed. Constitutional: She is oriented to person, place, and time. She appears well-developed and well-nourished. No distress.  HENT:  Head: Normocephalic.  Eyes: EOM are normal. Pupils are equal, round, and reactive to light.  Cardiovascular: Normal rate.   Pulmonary/Chest: Effort normal.  Neurological: She is alert and oriented to person, place, and time.  Skin: Burn and rash noted.       ED Course  Procedures (including critical care time) Labs Review Labs Reviewed - No data to display Imaging Review No  results found.  EKG Interpretation   None       MDM   Final diagnoses:  Chemical burn of scalp    Pt with chemical burns to the scalp after hair dyed on Monday.  No signs of secondary infection or allergic reaction.  Treated with pain control.    [name] Sprout, MD 11/30/13 [phone]

## 2013-11-30 NOTE — Discharge Instructions (Signed)
Chemical Burn Many chemicals can burn the skin. A chemical burn should be flushed with cool water and checked by an emergency caregiver. Your skin is a natural barrier to infection. It is the largest organ of your body. Burns damage this natural protection. To help prevent infection, it is very important to follow your caregiver's instructions in the care of your burn.  Many industrial chemicals may cause burns. These chemicals include acids, alkalis, and organic compounds such as petroleum, phenol, bitumen, tar, and grease. When acids come in contact with the skin, they cause an immediate change in the skin.Acid burns produce significant pain and form a scab (eschar). Usually, the immediate skin changes are the only damage from an acid burn.However, exposure to formic acid, chromic acid, or hydrofluoric acid may affect the whole body and may even be life-threatening. Alkalis include lye, cement, lime, and many chemicals with "hydroxide" in their name.An alkali burn may be less apparent than an acid burn at first. However, alkalis may cause greater tissue damage.It is important to be aware of any chemicals you are using. Treat any exposure to skin, eyes, or mucous membranes (nose, mouth, throat) as a potential emergency. PREVENTION  Avoid exposure to toxic chemicals that can cause burns.  Store chemicals out of the reach of children.  Use protective gloves when handling dangerous chemicals. HOME CARE INSTRUCTIONS   Wash your hands well before changing your bandage.  Change your bandage as often as directed by your caregiver.  Remove the old bandage. If the bandage sticks, you may soak it off with cool, clean water.  Cleanse the burn thoroughly but gently with mild soap and water.  Pat the area dry with a clean, dry cloth.  Apply a thin layer of antibacterial cream to the burn.  Apply a clean bandage as instructed by your caregiver.  Keep the bandage as clean and dry as  possible.  Elevate the affected area for the first 24 hours, then as instructed by your caregiver.  Only take over-the-counter or prescription medicines for pain, discomfort, or fever as directed by your caregiver.  Keep all follow-up appointments.This is important. This is how your caregiver can tell if your treatment is working. SEEK IMMEDIATE MEDICAL CARE IF:   You develop excessive pain.  You develop redness, tenderness, swelling, or red streaks near the burn.  The burned area develops yellowish-white fluid (pus) or a bad smell.  You have a fever. MAKE SURE YOU:   Understand these instructions.  Will watch your condition.  Will get help right away if you are not doing well or get worse. Document Released: 07/04/2004 Document Revised: 12/21/2011 Document Reviewed: 02/23/2011 [facility] Patient Information 2014 [place], [place].  Burn Care Burns hurt your skin. When your skin is hurt, it is easier to get an infection. Follow your doctor's directions to help prevent an infection. HOME CARE  Wash your hands well before you change your bandage.  Change your bandage as often as told by your doctor.  Remove the old bandage. If the bandage sticks, soak it off with cool, clean water.  Gently clean the burn with mild soap and water.  Pat the burn dry with a clean, dry cloth.  Put a thin layer of medicated cream on the burn.  Put a clean bandage on as told by your doctor.  Keep the bandage clean and dry.  Raise (elevate) the burn for the first 24 hours. After that, follow your doctor's directions.  Only take medicine as told by  your doctor. GET HELP RIGHT AWAY IF:   You have too much pain.  The skin near the burn is red, tender, puffy (swollen), or has red streaks.  The burn area has yellowish white fluid (pus) or a bad smell coming from it.  You have a fever. MAKE SURE YOU:   Understand these instructions.  Will watch your condition.  Will get help right away  if you are not doing well or get worse. Document Released: 07/07/2008 Document Revised: 12/21/2011 Document Reviewed: 02/18/2011 [facility] Patient Information 2014 [place], [place].

## 2013-11-30 NOTE — ED Notes (Signed)
Pt reports she had color treatment on her hair Monday and has scabbing and burning to scalp and right side of face.

## 2014-01-03 ENCOUNTER — Ambulatory Visit: Payer: Medicaid Other | Attending: Urology | Admitting: Physical Therapy

## 2014-01-03 DIAGNOSIS — R350 Frequency of micturition: Secondary | ICD-10-CM | POA: Insufficient documentation

## 2014-01-03 DIAGNOSIS — M2420 Disorder of ligament, unspecified site: Secondary | ICD-10-CM | POA: Insufficient documentation

## 2014-01-03 DIAGNOSIS — M629 Disorder of muscle, unspecified: Secondary | ICD-10-CM | POA: Insufficient documentation

## 2014-01-03 DIAGNOSIS — IMO0001 Reserved for inherently not codable concepts without codable children: Secondary | ICD-10-CM | POA: Insufficient documentation

## 2015-07-02 ENCOUNTER — Ambulatory Visit: Payer: Medicaid Other | Attending: Urology | Admitting: Physical Therapy

## 2015-07-02 ENCOUNTER — Encounter: Payer: Self-pay | Admitting: Physical Therapy

## 2015-07-02 DIAGNOSIS — R1030 Lower abdominal pain, unspecified: Secondary | ICD-10-CM | POA: Diagnosis not present

## 2015-07-02 DIAGNOSIS — M6289 Other specified disorders of muscle: Secondary | ICD-10-CM

## 2015-07-02 DIAGNOSIS — N8184 Pelvic muscle wasting: Secondary | ICD-10-CM | POA: Insufficient documentation

## 2015-07-02 NOTE — Therapy (Addendum)
[facility] [address], [place], [place], [address] Phone: [phone]   Fax:  [phone]  Physical Therapy Evaluation  Patient Details  Name: Christina Cox MRN: [id number] Date of Birth: 10/18/1980 Referring Provider:  Ardis Hughs, MD  Encounter Date: 07/02/2015      PT End of Session - 07/02/15 1317    Visit Number 1   Date for PT Re-Evaluation 08/27/15   PT Start Time 1230   PT Stop Time 1310   PT Time Calculation (min) 40 min   Activity Tolerance Patient tolerated treatment well   Behavior During Therapy [facility] for tasks assessed/performed      Past Medical History  Diagnosis Date  . Interstitial cystitis   . Depression   . Chronic migraine   . Wears glasses     Past Surgical History  Procedure Laterality Date  . Intrauterine device (iud) insertion  2013    mirena  . Bunionectomy Right age 22  . Breast enhancement surgery Bilateral 2012  . Wisdom tooth extraction  age 33  . Cysto with hydrodistension N/A 10/13/2013    Procedure: CYSTOSCOPY/HYDRODISTENSION;  Surgeon: Ardis Hughs, MD;  Location: [facility];  Service: Urology;  Laterality: N/A;    There were no vitals filed for this visit.  Visit Diagnosis:  Lower abdominal pain - Plan: PT plan of care cert/re-cert  Pelvic floor dysfunction - Plan: PT plan of care cert/re-cert      Subjective Assessment - 07/02/15 1238    Subjective Patient reports she is urinates during the day every 15 min.  At night she gets up 6 x per night. Has pain for 3 years. Pain came on suddenly. Bladder has been stretched to hold more urine.    How long can you sit comfortably? no difficulty   How long can you stand comfortably? no difficulty   How long can you walk comfortably? no difficulty   Patient Stated Goals to reduce pain and increased amount of time to urinate   Currently in Pain? Yes   Pain Score 9    Pain Location Abdomen  pressure   Pain  Orientation Lower   Pain Descriptors / Indicators Pressure   Pain Type Chronic pain   Pain Onset More than a month ago   Pain Frequency Constant   Aggravating Factors  not going to the bathroom, patient wakes up at night due to pain   Pain Relieving Factors going to the bathroom            [facility] PT Assessment - 07/02/15 0001    Assessment   Medical Diagnosis pelvic floor dysfunction; interstitial cytits   Onset Date/Surgical Date 10/13/11   Prior Therapy None   Precautions   Precautions None   Balance Screen   Has the patient fallen in the past 6 months No   Has the patient had a decrease in activity level because of a fear of falling?  No   Is the patient reluctant to leave their home because of a fear of falling?  No   Prior Function   Level of Independence Independent   Vocation Unemployed   Cognition   Overall Cognitive Status Within Functional Limits for tasks assessed   Posture/Postural Control   Posture/Postural Control Postural limitations   Postural Limitations Rounded Shoulders;Forward head   Posture Comments sits with legs crossed and flexed forward   ROM / Strength   AROM / PROM / Strength AROM;PROM;Strength   AROM  Lumbar Flexion full going to the left   Lumbar Extension full   Lumbar - Right Side Bend decreased by 25%   Lumbar - Left Side Bend full   Strength   Overall Strength Comments bil. hip abduction 4/5; abdominal strength 4/5   Palpation   Palpation comment tenderness located in lower abdominal area, bil. lumbar paraspinals                           PT Education - 07/02/15 1316    Education provided Yes   Education Details where to get a home TENS unit and how to use it, flexibility exercises, abdominal massage, toleting technique   Person(s) Educated Patient   Methods Explanation;Demonstration;Verbal cues;Handout   Comprehension Returned demonstration;Verbalized understanding          PT Short Term Goals - 07/02/15  1318    PT SHORT TERM GOAL #1   Title independent with flexibility exercises   Time 4   Period Weeks   Status New   PT SHORT TERM GOAL #2   Title understand how to perform toileting technique to reduce straining with bowel movement   Time 4   Period Weeks   Status New   PT SHORT TERM GOAL #3   Title understand how to perform soft tissue work around the urethra to reduce irritation   Time 4   Period Weeks   Status New           PT Long Term Goals - 07/02/15 1321    PT LONG TERM GOAL #1   Title urinate every 90 minutes due to ability to control the urge to urinate   Time 8   Period Weeks   Status New   PT LONG TERM GOAL #2   Title pain with daily activities decreased >/= 50% due to understanding how to manage her pain    Time 8   Period Weeks   Status New   PT LONG TERM GOAL #3   Title understand how to use Home TENS unit to control pain.    Time 8   Period Weeks   Status New   PT LONG TERM GOAL #4   Title independent with HEP    Time 8   Period Weeks   Status New               Plan - 07/02/15 1326    Clinical Impression Statement Patient is a 34 year old female with diagnosis of pelvic floor dysfunction and pain.  Patient reports she has had pain for 3 years with sudden onset.  Patient reports her constant pelvic pain is at level 9/10 and is releived by urinating. Bil. lumbar sidebending decreased by 25%.  Bilateral hip abduction 4/5 and abdominal strength is 4/5.  Palpable tenderness located in bil. lumbar paraspinals, lower abdominal area with thickness over the bladder. Patient reports she urinates every 15 minutes. Patient would benefit from physical therapy to reduce pain.    Pt will benefit from skilled therapeutic intervention in order to improve on the following deficits Decreased range of motion;Increased fascial restricitons;Increased muscle spasms;Decreased endurance;Decreased activity tolerance;Pain;Decreased mobility;Decreased strength   Rehab  Potential Excellent   PT Frequency 1x / week   PT Duration 8 weeks   PT Treatment/Interventions ADLs/Self Care Home Management;Biofeedback;Cryotherapy;Electrical Stimulation;Moist Heat;Therapeutic exercise;Therapeutic activities;Ultrasound;Neuromuscular re-education;Patient/family education;Manual techniques  HOme TENS unity   PT Next Visit Plan see if she got the home tens unit, soft tissue  work , diaphragmatic breathing   PT Home Exercise Plan diaphragmatic breathing   Recommended Other Services None   Consulted and Agree with Plan of Care Patient         Problem List Patient Active Problem List   Diagnosis Date Noted  . Migraine without aura 06/13/2013  . Depression 06/13/2013  . Anxiety 06/13/2013  . Insomnia 06/13/2013  . Chronic migraine 06/13/2013    GRAY,CHERYL,PT 07/02/2015, 1:34 PM  [facility] [address] [place], [place], [address] Phone: [phone]   Fax:  [phone]    PHYSICAL THERAPY DISCHARGE SUMMARY  Visits from Start of Care: 1  Current functional level related to goals / functional outcomes: See above.  Unable to assess patient due to her not returning to therapy.    Remaining deficits: See above   Education / Equipment: HEP Plan:                                                    Patient goals were not met. Patient is being discharged due to not returning since the last visit. Thank you for referral. Earlie Counts, PT 08/28/2015 3:31 PM   ?????

## 2015-07-02 NOTE — Patient Instructions (Signed)
About Abdominal Massage  Abdominal massage, also called external colon massage, is a self-treatment circular massage technique that can reduce and eliminate gas and ease constipation. The colon naturally contracts in waves in a clockwise direction starting from inside the right hip, moving up toward the ribs, across the belly, and down inside the left hip.  When you perform circular abdominal massage, you help stimulate your colon's normal wave pattern of movement called peristalsis.  It is most beneficial when done after eating.  Positioning You can practice abdominal massage with oil while lying down, or in the shower with soap.  Some people find that it is just as effective to do the massage through clothing while sitting or standing.  How to Massage Start by placing your finger tips or knuckles on your right side, just inside your hip bone.  . Make small circular movements while you move upward toward your rib cage.   . Once you reach the bottom right side of your rib cage, take your circular movements across to the left side of the bottom of your rib cage.  . Next, move downward until you reach the inside of your left hip bone.  This is the path your feces travel in your colon. . Continue to perform your abdominal massage in this pattern for 10 minutes each day.     You can apply as much pressure as is comfortable in your massage.  Start gently and build pressure as you continue to practice.  Notice any areas of pain as you massage; areas of slight pain may be relieved as you massage, but if you have areas of significant or intense pain, consult with your healthcare provider.  Other Considerations . General physical activity including bending and stretching can have a beneficial massage-like effect on the colon.  Deep breathing can also stimulate the colon because breathing deeply activates the same nervous system that supplies the colon.   . Abdominal massage should always be used in  combination with a bowel-conscious diet that is high in the proper type of fiber for you, fluids (primarily water), and a regular exercise program.  Also can do circular massage around the bladder area.  Toileting Techniques for Bowel Movements (Defecation) Using your belly (abdomen) and pelvic floor muscles to have a bowel movement is usually instinctive.  Sometimes people can have problems with these muscles and have to relearn proper defecation (emptying) techniques.  If you have weakness in your muscles, organs that are falling out, decreased sensation in your pelvis, or ignore your urge to go, you may find yourself straining to have a bowel movement.  You are straining if you are: . holding your breath or taking in a huge gulp of air and holding it  . keeping your lips and jaw tensed and closed tightly . turning red in the face because of excessive pushing or forcing . developing or worsening your  hemorrhoids . getting faint while pushing . not emptying completely and have to defecate many times a day  If you are straining, you are actually making it harder for yourself to have a bowel movement.  Many people find they are pulling up with the pelvic floor muscles and closing off instead of opening the anus. Due to lack pelvic floor relaxation and coordination the abdominal muscles, one has to work harder to push the feces out.  Many people have never been taught how to defecate efficiently and effectively.  Notice what happens to your body when you are  having a bowel movement.  While you are sitting on the toilet pay attention to the following areas: . Jaw and mouth position . Angle of your hips   . Whether your feet touch the ground or not . Arm placement  . Spine position . Waist . Belly tension . Anus (opening of the anal canal)  An Evacuation/Defecation Plan   Here are the 4 basic points:  1. Lean forward enough for your elbows to rest on your knees 2. Support your feet on the  floor or use a low stool if your feet don't touch the floor  3. Push out your belly as if you have swallowed a beach ball-you should feel a widening of your waist 4. Open and relax your pelvic floor muscles, rather than tightening around the anus      The following conditions my require modifications to your toileting posture:  . If you have had surgery in the past that limits your back, hip, pelvic, knee or ankle flexibility . Constipation   Your healthcare practitioner may make the following additional suggestions and adjustments:  1) Sit on the toilet  a) Make sure your feet are supported. b) Notice your hip angle and spine position-most people find it effective to lean forward or raise their knees, which can help the muscles around the anus to relax  c) When you lean forward, place your forearms on your thighs for support  2) Relax suggestions a) Breath deeply in through your nose and out slowly through your mouth as if you are smelling the flowers and blowing out the candles. b) To become aware of how to relax your muscles, contracting and releasing muscles can be helpful.  Pull your pelvic floor muscles in tightly by using the image of holding back gas, or closing around the anus (visualize making a circle smaller) and lifting the anus up and in.  Then release the muscles and your anus should drop down and feel open. Repeat 5 times ending with the feeling of relaxation. c) Keep your pelvic floor muscles relaxed; let your belly bulge out. d) The digestive tract starts at the mouth and ends at the anal opening, so be sure to relax both ends of the tube.  Place your tongue on the roof of your mouth with your teeth separated.  This helps relax your mouth and will help to relax the anus at the same time.  3) Empty (defecation) a) Keep your pelvic floor and sphincter relaxed, then bulge your anal muscles.  Make the anal opening wide.  b) Stick your belly out as if you have swallowed a  beach ball. c) Make your belly wall hard using your belly muscles while continuing to breathe. Doing this makes it easier to open your anus. d) Breath out and give a grunt (or try using other sounds such as ahhhh, shhhhh, ohhhh or grrrrrrr).  4) Finish a) As you finish your bowel movement, pull the pelvic floor muscles up and in.  This will leave your anus in the proper place rather than remaining pushed out and down. If you leave your anus pushed out and down, it will start to feel as though that is normal and give you incorrect signals about needing to have a bowel movement.   Butterfly, Sitting   Sit straight or with back against wall. Gently push knees toward floor. Hold _30__ seconds. Repeat _2__ times per session. Do _1__ sessions per day.  Copyright  VHI. All rights reserved.  Christina Cox, PT [facility] [address] [place], [place] [address] Adductor: Curley Spice   Lie on back with hips against wall, back of thighs on wall. Hold 30 seconds then  Pull legs apart until stretch is felt in inner thighs. Hold _30__ seconds. Relax. Repeat _2__ times. Do _1__ times a day. Advanced: At end of stretch, rotate thighs outward.  Copyright  VHI. All rights reserved.  Posterior Hip: Chair Stretch   Sit in chair, right ankle on other thigh. Lean forearm onto knee until stretch is felt in back of hip. Hold 30___ seconds. Relax. Repeat __2_ times. Do _1__ times a day. Repeat on other leg.  Copyright  VHI. All rights reserved.  Mid-Back Stretch   Push chest toward floor, reaching forward as far as possible. Hold _30___ seconds. Repeat __2__ times per set. Do __1__ sets per session. Do __1__ sessions per day.  http://orth.exer.us/130   Copyright  VHI. All rights reserved.  Press-Up   Press upper body upward, keeping hips in contact with floor. Keep lower back and buttocks relaxed. Hold __5__ seconds. Repeat __3__ times per set. Do  __1__ sets per session. Do _1___ sessions per day.  http://orth.exer.us/94   Copyright  VHI. All rights reserved.

## 2016-05-27 LAB — OB RESULTS CONSOLE HEPATITIS B SURFACE ANTIGEN: Hepatitis B Surface Ag: NEGATIVE

## 2016-05-27 LAB — OB RESULTS CONSOLE ABO/RH: RH Type: POSITIVE

## 2016-05-27 LAB — OB RESULTS CONSOLE ANTIBODY SCREEN: ANTIBODY SCREEN: NEGATIVE

## 2016-05-27 LAB — OB RESULTS CONSOLE GC/CHLAMYDIA
CHLAMYDIA, DNA PROBE: NEGATIVE
GC PROBE AMP, GENITAL: NEGATIVE

## 2016-05-27 LAB — OB RESULTS CONSOLE HIV ANTIBODY (ROUTINE TESTING): HIV: NONREACTIVE

## 2016-05-27 LAB — OB RESULTS CONSOLE RPR: RPR: NONREACTIVE

## 2016-05-27 LAB — OB RESULTS CONSOLE RUBELLA ANTIBODY, IGM: RUBELLA: IMMUNE

## 2016-12-02 LAB — OB RESULTS CONSOLE GBS: STREP GROUP B AG: NEGATIVE

## 2016-12-23 ENCOUNTER — Encounter (HOSPITAL_COMMUNITY): Payer: Self-pay | Admitting: *Deleted

## 2016-12-23 ENCOUNTER — Other Ambulatory Visit: Payer: Self-pay | Admitting: Obstetrics and Gynecology

## 2016-12-23 ENCOUNTER — Telehealth (HOSPITAL_COMMUNITY): Payer: Self-pay | Admitting: *Deleted

## 2016-12-23 NOTE — Telephone Encounter (Signed)
Preadmission screen  

## 2016-12-24 NOTE — Treatment Plan (Signed)
Spoke with Dr. Charlott Rakesoss's nurse regarding the need for Admit and IOL orders prior to patients IOL tonight.

## 2016-12-25 ENCOUNTER — Inpatient Hospital Stay (HOSPITAL_COMMUNITY): Payer: Medicaid Other | Admitting: Anesthesiology

## 2016-12-25 ENCOUNTER — Encounter (HOSPITAL_COMMUNITY): Payer: Self-pay

## 2016-12-25 ENCOUNTER — Inpatient Hospital Stay (HOSPITAL_COMMUNITY)
Admission: RE | Admit: 2016-12-25 | Discharge: 2016-12-28 | DRG: 765 | Disposition: A | Discharge status: Home / Self Care | Payer: Medicaid Other | Source: Ambulatory Visit | Attending: Obstetrics and Gynecology | Admitting: Obstetrics and Gynecology

## 2016-12-25 VITALS — BP 112/62 | HR 78 | Temp 97.9°F | Resp 19 | Ht 66.0 in | Wt 213.0 lb

## 2016-12-25 DIAGNOSIS — O411230 Chorioamnionitis, third trimester, not applicable or unspecified: Secondary | ICD-10-CM | POA: Diagnosis present

## 2016-12-25 DIAGNOSIS — Z3A39 39 weeks gestation of pregnancy: Secondary | ICD-10-CM | POA: Diagnosis not present

## 2016-12-25 DIAGNOSIS — Z87891 Personal history of nicotine dependence: Secondary | ICD-10-CM

## 2016-12-25 DIAGNOSIS — O9081 Anemia of the puerperium: Secondary | ICD-10-CM | POA: Diagnosis not present

## 2016-12-25 DIAGNOSIS — O09523 Supervision of elderly multigravida, third trimester: Secondary | ICD-10-CM

## 2016-12-25 DIAGNOSIS — O09529 Supervision of elderly multigravida, unspecified trimester: Secondary | ICD-10-CM

## 2016-12-25 DIAGNOSIS — Z3493 Encounter for supervision of normal pregnancy, unspecified, third trimester: Secondary | ICD-10-CM | POA: Diagnosis present

## 2016-12-25 DIAGNOSIS — D649 Anemia, unspecified: Secondary | ICD-10-CM | POA: Diagnosis not present

## 2016-12-25 LAB — CBC
HEMATOCRIT: 34.4 % — AB (ref 36.0–46.0)
Hemoglobin: 10.8 g/dL — ABNORMAL LOW (ref 12.0–15.0)
MCH: 25.8 pg — AB (ref 26.0–34.0)
MCHC: 31.4 g/dL (ref 30.0–36.0)
MCV: 82.1 fL (ref 78.0–100.0)
Platelets: 197 10*3/uL (ref 150–400)
RBC: 4.19 MIL/uL (ref 3.87–5.11)
RDW: 14.3 % (ref 11.5–15.5)
WBC: 12.9 10*3/uL — ABNORMAL HIGH (ref 4.0–10.5)

## 2016-12-25 LAB — ABO/RH: ABO/RH(D): A POS

## 2016-12-25 LAB — TYPE AND SCREEN
ABO/RH(D): A POS
Antibody Screen: NEGATIVE

## 2016-12-25 MED ORDER — FENTANYL 2.5 MCG/ML BUPIVACAINE 1/10 % EPIDURAL INFUSION (WH - ANES)
14.0000 mL/h | INTRAMUSCULAR | Status: DC | PRN
  Administered 2016-12-25 – 2016-12-26 (×4): 14 mL/h via EPIDURAL
  Filled 2016-12-25: qty 100

## 2016-12-25 MED ORDER — LIDOCAINE HCL (PF) 1 % IJ SOLN: 30.0000 mL | INTRAMUSCULAR | Status: DC | PRN

## 2016-12-25 MED ORDER — EPHEDRINE 5 MG/ML INJ: 10.0000 mg | INTRAVENOUS | Status: DC | PRN

## 2016-12-25 MED ORDER — LACTATED RINGERS IV SOLN: 500.0000 mL | Freq: Once | INTRAVENOUS | Status: DC

## 2016-12-25 MED ORDER — PHENYLEPHRINE 40 MCG/ML (10ML) SYRINGE FOR IV PUSH (FOR BLOOD PRESSURE SUPPORT): 80.0000 ug | PREFILLED_SYRINGE | INTRAVENOUS | Status: DC | PRN

## 2016-12-25 MED ORDER — TERBUTALINE SULFATE 1 MG/ML IJ SOLN: 0.2500 mg | Freq: Once | INTRAMUSCULAR | Status: DC | PRN

## 2016-12-25 MED ORDER — BUTORPHANOL TARTRATE 1 MG/ML IJ SOLN
1.0000 mg | Freq: Once | INTRAMUSCULAR | Status: AC
  Administered 2016-12-25: 1 mg via INTRAVENOUS
  Filled 2016-12-25: qty 1

## 2016-12-25 MED ORDER — DIPHENHYDRAMINE HCL 50 MG/ML IJ SOLN: 12.5000 mg | INTRAMUSCULAR | Status: DC | PRN

## 2016-12-25 MED ORDER — ACETAMINOPHEN 325 MG PO TABS
650.0000 mg | ORAL_TABLET | ORAL | Status: DC | PRN
  Administered 2016-12-25 – 2016-12-26 (×4): 650 mg via ORAL
  Filled 2016-12-25 (×4): qty 2

## 2016-12-25 MED ORDER — SOD CITRATE-CITRIC ACID 500-334 MG/5ML PO SOLN
30.0000 mL | ORAL | Status: DC | PRN
  Administered 2016-12-25 – 2016-12-26 (×4): 30 mL via ORAL
  Filled 2016-12-25 (×4): qty 15

## 2016-12-25 MED ORDER — OXYTOCIN BOLUS FROM INFUSION: 500.0000 mL | Freq: Once | INTRAVENOUS | Status: DC

## 2016-12-25 MED ORDER — OXYTOCIN 40 UNITS IN LACTATED RINGERS INFUSION - SIMPLE MED
1.0000 m[IU]/min | INTRAVENOUS | Status: DC
  Administered 2016-12-25: 6 m[IU]/min via INTRAVENOUS
  Administered 2016-12-25: 2 m[IU]/min via INTRAVENOUS
  Filled 2016-12-25: qty 1000

## 2016-12-25 MED ORDER — FENTANYL 2.5 MCG/ML BUPIVACAINE 1/10 % EPIDURAL INFUSION (WH - ANES)
14.0000 mL/h | INTRAMUSCULAR | Status: DC | PRN
Start: 2016-12-25 — End: 2016-12-27
  Administered 2016-12-25 – 2016-12-26 (×2): 14 mL/h via EPIDURAL
  Filled 2016-12-25 (×5): qty 100

## 2016-12-25 MED ORDER — MISOPROSTOL 25 MCG QUARTER TABLET
25.0000 ug | ORAL_TABLET | ORAL | Status: DC | PRN
  Administered 2016-12-25 (×4): 25 ug via VAGINAL
  Filled 2016-12-25 (×5): qty 0.25

## 2016-12-25 MED ORDER — OXYTOCIN 40 UNITS IN LACTATED RINGERS INFUSION - SIMPLE MED: 2.5000 [IU]/h | INTRAVENOUS | Status: DC

## 2016-12-25 MED ORDER — LACTATED RINGERS IV SOLN
500.0000 mL | INTRAVENOUS | Status: DC | PRN
  Administered 2016-12-26: 500 mL via INTRAVENOUS

## 2016-12-25 MED ORDER — PHENYLEPHRINE 40 MCG/ML (10ML) SYRINGE FOR IV PUSH (FOR BLOOD PRESSURE SUPPORT)
80.0000 ug | PREFILLED_SYRINGE | INTRAVENOUS | Status: DC | PRN
Start: 2016-12-25 — End: 2016-12-27
  Filled 2016-12-25: qty 10

## 2016-12-25 MED ORDER — LIDOCAINE HCL (PF) 1 % IJ SOLN
INTRAMUSCULAR | Status: DC | PRN
  Administered 2016-12-25: 10 mL via EPIDURAL

## 2016-12-25 MED ORDER — DIPHENHYDRAMINE HCL 50 MG/ML IJ SOLN
12.5000 mg | INTRAMUSCULAR | Status: DC | PRN
  Administered 2016-12-26 (×2): 12.5 mg via INTRAVENOUS
  Filled 2016-12-25: qty 1

## 2016-12-25 MED ORDER — ONDANSETRON HCL 4 MG/2ML IJ SOLN
4.0000 mg | Freq: Four times a day (QID) | INTRAMUSCULAR | Status: DC | PRN
  Administered 2016-12-25: 4 mg via INTRAVENOUS
  Filled 2016-12-25: qty 2

## 2016-12-25 MED ORDER — LACTATED RINGERS IV SOLN
INTRAVENOUS | Status: DC
  Administered 2016-12-25 – 2016-12-26 (×7): via INTRAVENOUS

## 2016-12-25 NOTE — Anesthesia Procedure Notes (Addendum)
Epidural Patient location during procedure: OB Start time: 12/25/2016 9:14 AM End time: 12/25/2016 9:44 AM  Staffing Anesthesiologist: Anitra LauthMILLER, WARREN RAY Performed: anesthesiologist   Preanesthetic Checklist Completed: patient identified, site marked, surgical consent, pre-op evaluation, timeout performed, IV checked, risks and benefits discussed and monitors and equipment checked  Epidural Patient position: sitting Prep: DuraPrep Patient monitoring: heart rate, cardiac monitor, continuous pulse ox and blood pressure Approach: midline Location: L2-L3 Injection technique: LOR saline  Needle:  Needle type: Tuohy  Needle gauge: 17 G Needle length: 9 cm Needle insertion depth: 6 cm Catheter type: closed end flexible Catheter size: 20 Guage Catheter at skin depth: 9 cm Test dose: negative  Assessment Events: blood not aspirated, injection not painful, no injection resistance, negative IV test and no paresthesia  Additional Notes Reason for block:procedure for pain

## 2016-12-25 NOTE — H&P (Signed)
36 y.o. 6762w2d  G2P1001 comes in for IOL d/t AMA.  Otherwise has good fetal movement and no bleeding.  History reviewed. No pertinent past medical history.  Past Surgical History:  Procedure Laterality Date  . BREAST SURGERY     implants  . BUNIONECTOMY      OB History  Gravida Para Term Preterm AB Living  2 1 1     1   SAB TAB Ectopic Multiple Live Births          1    # Outcome Date GA Lbr Len/2nd Weight Sex Delivery Anes PTL Lv  2 Current           1 Term 2003 3440w0d  2.977 kg (6 lb 9 oz) M Vag-Spont EPI  LIV      Social History   Social History  . Marital status: Single    Spouse name: N/A  . Number of children: N/A  . Years of education: N/A   Occupational History  . Not on file.   Social History Main Topics  . Smoking status: Former Games developermoker  . Smokeless tobacco: Never Used  . Alcohol use No  . Drug use: No  . Sexual activity: Not on file   Other Topics Concern  . Not on file   Social History Narrative  . No narrative on file   Patient has no known allergies.    Prenatal Transfer Tool  Maternal Diabetes: No Genetic Screening: Declined Maternal Ultrasounds/Referrals: Normal Fetal Ultrasounds or other Referrals:  None Maternal Substance Abuse:  No Significant Maternal Medications:  None Significant Maternal Lab Results: Lab values include: Group B Strep negative  Other PNC: uncomplicated.    Vitals:   12/25/16 2030 12/25/16 2100  BP: 122/75 126/78  Pulse: 97 98  Resp:    Temp:       Lungs/Cor:  NAD Abdomen:  soft, gravid Ex:  no cords, erythema SVE:  1/50/-3 FHTs:  135, good STV, NST R Toco:  2-6 Last US for EFW at 37.2wks was 655 (6#5)  A/P   Admitted for IOL d/t AMA  Pt received 4 doses of cytotoc overnight and throughout the day with minimal change.  Attempt at foley bulb placement unsuccessful Will switch to pitocin 2x2 overnight Recent call from RN stating pt with neck and upper back pain, without relief from hot/cold packs and  tylenol.  Ok for 1 dose of stadol.  GBSNeg  [place], [place]

## 2016-12-25 NOTE — Anesthesia Preprocedure Evaluation (Addendum)
Anesthesia Evaluation  Patient identified by MRN, date of birth, ID band Patient awake    Reviewed: Allergy & Precautions, NPO status , Patient's Chart, lab work & pertinent test results  Airway Mallampati: II  TM Distance: >3 FB Neck ROM: Full    Dental no notable dental hx.    Pulmonary neg pulmonary ROS, former smoker,    Pulmonary exam normal breath sounds clear to auscultation       Cardiovascular negative cardio ROS Normal cardiovascular exam Rhythm:Regular Rate:Normal     Neuro/Psych negative neurological ROS  negative psych ROS   GI/Hepatic negative GI ROS, Neg liver ROS,   Endo/Other  negative endocrine ROS  Renal/GU negative Renal ROS  negative genitourinary   Musculoskeletal negative musculoskeletal ROS (+)   Abdominal   Peds negative pediatric ROS (+)  Hematology negative hematology ROS (+)   Anesthesia Other Findings   Reproductive/Obstetrics negative OB ROS (+) Pregnancy                             Anesthesia Physical Anesthesia Plan  ASA: II  Anesthesia Plan: Epidural   Post-op Pain Management:    Induction:   Airway Management Planned: Natural Airway  Additional Equipment:   Intra-op Plan:   Post-operative Plan:   Informed Consent:   Plan Discussed with:   Anesthesia Plan Comments:         Anesthesia Quick Evaluation

## 2016-12-25 NOTE — Anesthesia Pain Management Evaluation Note (Signed)
  CRNA Pain Management Visit Note  Patient: Christina Cox, 36 y.o., female  "Hello I am a member of the anesthesia team at [facility]. We have an anesthesia team available at all times to provide care throughout the hospital, including epidural management and anesthesia for C-section. I don't know your plan for the delivery whether it a natural birth, water birth, IV sedation, nitrous supplementation, doula or epidural, but we want to meet your pain goals."   1.Was your pain managed to your expectations on prior hospitalizations?   Yes   2.What is your expectation for pain management during this hospitalization?     Epidural  3.How can we help you reach that goal? Epidural  Record the patient's initial score and the patient's pain goal.   Pain: 2  Pain Goal: 4 [facility] wants you to be able to say your pain was always managed very well.  Noel Rodier 12/25/2016

## 2016-12-26 ENCOUNTER — Encounter (HOSPITAL_COMMUNITY)
Admission: RE | Disposition: A | Discharge status: Home / Self Care | Payer: Self-pay | Source: Ambulatory Visit | Attending: Obstetrics and Gynecology

## 2016-12-26 ENCOUNTER — Encounter (HOSPITAL_COMMUNITY): Payer: Self-pay

## 2016-12-26 LAB — RPR: RPR Ser Ql: NONREACTIVE

## 2016-12-26 SURGERY — Surgical Case
Anesthesia: Epidural

## 2016-12-26 MED ORDER — OXYTOCIN 10 UNIT/ML IJ SOLN
INTRAVENOUS | Status: DC | PRN
  Administered 2016-12-26: 40 [IU] via INTRAVENOUS

## 2016-12-26 MED ORDER — ONDANSETRON HCL 4 MG/2ML IJ SOLN
INTRAMUSCULAR | Status: AC
  Filled 2016-12-26: qty 2

## 2016-12-26 MED ORDER — OXYTOCIN 10 UNIT/ML IJ SOLN
INTRAMUSCULAR | Status: AC
  Filled 2016-12-26: qty 4

## 2016-12-26 MED ORDER — MORPHINE SULFATE (PF) 0.5 MG/ML IJ SOLN
INTRAMUSCULAR | Status: AC
Start: 2016-12-26 — End: 2016-12-26
  Filled 2016-12-26: qty 10

## 2016-12-26 MED ORDER — LIDOCAINE-EPINEPHRINE (PF) 2 %-1:200000 IJ SOLN
INTRAMUSCULAR | Status: AC
  Filled 2016-12-26: qty 20

## 2016-12-26 MED ORDER — SCOPOLAMINE 1 MG/3DAYS TD PT72
MEDICATED_PATCH | TRANSDERMAL | Status: AC
  Filled 2016-12-26: qty 1

## 2016-12-26 MED ORDER — SODIUM CHLORIDE 0.9 % IV SOLN
2.0000 g | Freq: Four times a day (QID) | INTRAVENOUS | Status: DC
  Administered 2016-12-26 (×2): 2 g via INTRAVENOUS
  Filled 2016-12-26 (×4): qty 2000

## 2016-12-26 MED ORDER — GENTAMICIN SULFATE 40 MG/ML IJ SOLN
180.0000 mg | Freq: Three times a day (TID) | INTRAVENOUS | Status: DC
  Administered 2016-12-26: 180 mg via INTRAVENOUS
  Filled 2016-12-26 (×3): qty 4.5

## 2016-12-26 MED ORDER — CLINDAMYCIN PHOSPHATE 900 MG/50ML IV SOLN
900.0000 mg | Freq: Once | INTRAVENOUS | Status: AC
  Administered 2016-12-26: 900 mg via INTRAVENOUS
  Filled 2016-12-26: qty 50

## 2016-12-26 MED ORDER — SODIUM BICARBONATE 8.4 % IV SOLN
INTRAVENOUS | Status: DC | PRN
  Administered 2016-12-26 (×4): 5 mL via EPIDURAL

## 2016-12-26 MED ORDER — SCOPOLAMINE 1 MG/3DAYS TD PT72
MEDICATED_PATCH | TRANSDERMAL | Status: DC | PRN
  Administered 2016-12-26: 1 via TRANSDERMAL

## 2016-12-26 MED ORDER — MORPHINE SULFATE (PF) 0.5 MG/ML IJ SOLN
INTRAMUSCULAR | Status: DC | PRN
  Administered 2016-12-26: 4 mg via EPIDURAL
  Administered 2016-12-26: 1 mg via INTRAVENOUS

## 2016-12-26 MED ORDER — LACTATED RINGERS IV SOLN
INTRAVENOUS | Status: DC | PRN
  Administered 2016-12-26: via INTRAVENOUS

## 2016-12-26 MED ORDER — ONDANSETRON HCL 4 MG/2ML IJ SOLN
INTRAMUSCULAR | Status: DC | PRN
  Administered 2016-12-26: 4 mg via INTRAVENOUS

## 2016-12-26 SURGICAL SUPPLY — 33 items
CHLORAPREP W/TINT 26ML (MISCELLANEOUS) ×3 IMPLANT
CLAMP CORD UMBIL (MISCELLANEOUS) IMPLANT
CLOTH BEACON ORANGE TIMEOUT ST (SAFETY) ×3 IMPLANT
DRSG OPSITE POSTOP 4X10 (GAUZE/BANDAGES/DRESSINGS) ×3 IMPLANT
ELECT REM PT RETURN 9FT ADLT (ELECTROSURGICAL) ×3
ELECTRODE REM PT RTRN 9FT ADLT (ELECTROSURGICAL) ×1 IMPLANT
EXTRACTOR VACUUM M CUP 4 TUBE (SUCTIONS) IMPLANT
EXTRACTOR VACUUM M CUP 4' TUBE (SUCTIONS)
GLOVE BIOGEL PI IND STRL 6.5 (GLOVE) ×1 IMPLANT
GLOVE BIOGEL PI IND STRL 7.0 (GLOVE) ×1 IMPLANT
GLOVE BIOGEL PI INDICATOR 6.5 (GLOVE) ×2
GLOVE BIOGEL PI INDICATOR 7.0 (GLOVE) ×2
GLOVE ECLIPSE 6.5 STRL STRAW (GLOVE) ×3 IMPLANT
GOWN STRL REUS W/TWL LRG LVL3 (GOWN DISPOSABLE) ×6 IMPLANT
KIT ABG SYR 3ML LUER SLIP (SYRINGE) IMPLANT
NDL HYPO 25X5/8 SAFETYGLIDE (NEEDLE) IMPLANT
NEEDLE HYPO 25X5/8 SAFETYGLIDE (NEEDLE) IMPLANT
NS IRRIG 1000ML POUR BTL (IV SOLUTION) ×3 IMPLANT
PACK C SECTION WH (CUSTOM PROCEDURE TRAY) ×3 IMPLANT
PAD ABD 7.5X8 STRL (GAUZE/BANDAGES/DRESSINGS) IMPLANT
PAD OB MATERNITY 4.3X12.25 (PERSONAL CARE ITEMS) ×3 IMPLANT
PENCIL SMOKE EVAC W/HOLSTER (ELECTROSURGICAL) ×3 IMPLANT
RTRCTR C-SECT PINK 25CM LRG (MISCELLANEOUS) ×3 IMPLANT
STAPLER VISISTAT 35W (STAPLE) ×2 IMPLANT
SUT MON AB 2-0 CT1 27 (SUTURE) ×3 IMPLANT
SUT MON AB 4-0 PS1 27 (SUTURE) IMPLANT
SUT PDS AB 0 CTX 60 (SUTURE) IMPLANT
SUT PLAIN 2 0 XLH (SUTURE) IMPLANT
SUT VIC AB 0 CTX 36 (SUTURE) ×12
SUT VIC AB 0 CTX36XBRD ANBCTRL (SUTURE) ×4 IMPLANT
SUT VIC AB 4-0 KS 27 (SUTURE) IMPLANT
TOWEL OR 17X24 6PK STRL BLUE (TOWEL DISPOSABLE) ×3 IMPLANT
TRAY FOLEY CATH SILVER 14FR (SET/KITS/TRAYS/PACK) ×3 IMPLANT

## 2016-12-26 NOTE — Progress Notes (Signed)
Pt's neck pain from overnight now completely resolved.  Anesthesia evaluated pt and found that PCA use had likely been a factor, pt now sitting and level appropriate.  Now feeling well without complaint. FHT:145 +A no D TOCO:q3-4 SVE: 2/80/-2 AROM clear Continue pitocin 2x2 Cont SCDs Cont other routine care

## 2016-12-26 NOTE — Progress Notes (Signed)
.   Pharmacy Antibiotic Note  Christina Cox is a 36 y.o. female [facility] on 12/25/2016 [facility]  Pharmacy has been consulted for Gentamicin dosing for Triple I due to maternal temp during labor.  Plan: Gentamicin 180mg  IV q8h Will plan to follow and assess need for SCr and further kinetic workup based on duration of therapy  Height: 5\' 6"  (167.6 cm) Weight: 213 lb (96.6 kg) IBW/kg (Calculated) : 59.3  Adjusted BW: 70.5kg  Temp (24hrs), Avg:99 F (37.2 C), Min:98 F (36.7 C), Max:101.3 F (38.5 C)   Recent Labs Lab 12/25/16 0115  WBC 12.9*    Estimated SCr=0.68 with estimated CrCl ~ 16006ml/min  No Known Allergies  Antimicrobials this admission: Ampicillin 2 gram IV q6h  3/17 >>  Dose adjustments this admission:   Microbiology results:   Thank you for allowing pharmacy to be a part of this patient's care.  Christina Jabsngel, Laquinda Moller Cox 12/26/2016 4:25 PM

## 2016-12-27 ENCOUNTER — Encounter (HOSPITAL_COMMUNITY): Payer: Self-pay

## 2016-12-27 LAB — CBC
HCT: 27.7 % — ABNORMAL LOW (ref 36.0–46.0)
Hemoglobin: 9 g/dL — ABNORMAL LOW (ref 12.0–15.0)
MCH: 26.3 pg (ref 26.0–34.0)
MCHC: 32.5 g/dL (ref 30.0–36.0)
MCV: 81 fL (ref 78.0–100.0)
Platelets: 160 10*3/uL (ref 150–400)
RBC: 3.42 MIL/uL — AB (ref 3.87–5.11)
RDW: 14.5 % (ref 11.5–15.5)
WBC: 19.2 10*3/uL — ABNORMAL HIGH (ref 4.0–10.5)

## 2016-12-27 MED ORDER — DIPHENHYDRAMINE HCL 25 MG PO CAPS
25.0000 mg | ORAL_CAPSULE | Freq: Four times a day (QID) | ORAL | Status: DC | PRN
  Administered 2016-12-27: 25 mg via ORAL
  Filled 2016-12-27: qty 1

## 2016-12-27 MED ORDER — SIMETHICONE 80 MG PO CHEW
80.0000 mg | CHEWABLE_TABLET | Freq: Three times a day (TID) | ORAL | Status: DC
  Administered 2016-12-27 – 2016-12-28 (×5): 80 mg via ORAL
  Filled 2016-12-27 (×6): qty 1

## 2016-12-27 MED ORDER — PRENATAL MULTIVITAMIN CH
1.0000 | ORAL_TABLET | Freq: Every day | ORAL | Status: DC
  Administered 2016-12-27 – 2016-12-28 (×2): 1 via ORAL
  Filled 2016-12-27 (×2): qty 1

## 2016-12-27 MED ORDER — ACETAMINOPHEN 325 MG PO TABS
650.0000 mg | ORAL_TABLET | ORAL | Status: DC | PRN
  Administered 2016-12-27: 650 mg via ORAL
  Filled 2016-12-27: qty 2

## 2016-12-27 MED ORDER — FERROUS SULFATE 325 (65 FE) MG PO TABS
325.0000 mg | ORAL_TABLET | Freq: Every day | ORAL | Status: DC
  Administered 2016-12-28: 325 mg via ORAL
  Filled 2016-12-27: qty 1

## 2016-12-27 MED ORDER — FENTANYL CITRATE (PF) 100 MCG/2ML IJ SOLN: 25.0000 ug | INTRAMUSCULAR | Status: DC | PRN

## 2016-12-27 MED ORDER — TETANUS-DIPHTH-ACELL PERTUSSIS 5-2.5-18.5 LF-MCG/0.5 IM SUSP: 0.5000 mL | Freq: Once | INTRAMUSCULAR | Status: DC

## 2016-12-27 MED ORDER — OXYCODONE-ACETAMINOPHEN 5-325 MG PO TABS: 2.0000 | ORAL_TABLET | ORAL | Status: DC | PRN

## 2016-12-27 MED ORDER — DIBUCAINE 1 % RE OINT: 1.0000 "application " | TOPICAL_OINTMENT | RECTAL | Status: DC | PRN

## 2016-12-27 MED ORDER — IBUPROFEN 600 MG PO TABS: 600.0000 mg | ORAL_TABLET | Freq: Four times a day (QID) | ORAL | Status: DC

## 2016-12-27 MED ORDER — OXYTOCIN 40 UNITS IN LACTATED RINGERS INFUSION - SIMPLE MED
2.5000 [IU]/h | INTRAVENOUS | Status: AC
  Administered 2016-12-27: 2.5 [IU]/h via INTRAVENOUS

## 2016-12-27 MED ORDER — SIMETHICONE 80 MG PO CHEW: 80.0000 mg | CHEWABLE_TABLET | ORAL | Status: DC | PRN

## 2016-12-27 MED ORDER — ZOLPIDEM TARTRATE 5 MG PO TABS: 5.0000 mg | ORAL_TABLET | Freq: Every evening | ORAL | Status: DC | PRN

## 2016-12-27 MED ORDER — OXYCODONE-ACETAMINOPHEN 5-325 MG PO TABS
1.0000 | ORAL_TABLET | ORAL | Status: DC | PRN
  Administered 2016-12-27 – 2016-12-28 (×7): 1 via ORAL
  Filled 2016-12-27 (×8): qty 1

## 2016-12-27 MED ORDER — MENTHOL 3 MG MT LOZG: 1.0000 | LOZENGE | OROMUCOSAL | Status: DC | PRN

## 2016-12-27 MED ORDER — METOCLOPRAMIDE HCL 5 MG/ML IJ SOLN: 10.0000 mg | Freq: Once | INTRAMUSCULAR | Status: DC | PRN

## 2016-12-27 MED ORDER — SENNOSIDES-DOCUSATE SODIUM 8.6-50 MG PO TABS
2.0000 | ORAL_TABLET | ORAL | Status: DC
  Administered 2016-12-28: 2 via ORAL
  Filled 2016-12-27: qty 2

## 2016-12-27 MED ORDER — KETOROLAC TROMETHAMINE 30 MG/ML IJ SOLN
INTRAMUSCULAR | Status: AC
  Administered 2016-12-27: 30 mg via INTRAVENOUS
  Filled 2016-12-27: qty 1

## 2016-12-27 MED ORDER — IBUPROFEN 600 MG PO TABS
600.0000 mg | ORAL_TABLET | Freq: Four times a day (QID) | ORAL | Status: DC
  Administered 2016-12-27 – 2016-12-28 (×5): 600 mg via ORAL
  Filled 2016-12-27 (×5): qty 1

## 2016-12-27 MED ORDER — WITCH HAZEL-GLYCERIN EX PADS: 1.0000 "application " | MEDICATED_PAD | CUTANEOUS | Status: DC | PRN

## 2016-12-27 MED ORDER — DIPHENHYDRAMINE HCL 50 MG/ML IJ SOLN: 12.5000 mg | Freq: Four times a day (QID) | INTRAMUSCULAR | Status: DC | PRN

## 2016-12-27 MED ORDER — GENTAMICIN SULFATE 40 MG/ML IJ SOLN
180.0000 mg | Freq: Once | INTRAMUSCULAR | Status: AC
  Administered 2016-12-27: 180 mg via INTRAVENOUS
  Filled 2016-12-27: qty 4.5

## 2016-12-27 MED ORDER — KETOROLAC TROMETHAMINE 30 MG/ML IJ SOLN
30.0000 mg | Freq: Once | INTRAMUSCULAR | Status: AC | PRN
  Administered 2016-12-27: 30 mg via INTRAVENOUS

## 2016-12-27 MED ORDER — SIMETHICONE 80 MG PO CHEW
80.0000 mg | CHEWABLE_TABLET | ORAL | Status: DC
  Administered 2016-12-28: 80 mg via ORAL
  Filled 2016-12-27: qty 1

## 2016-12-27 MED ORDER — LACTATED RINGERS IV SOLN: INTRAVENOUS | Status: DC

## 2016-12-27 MED ORDER — COCONUT OIL OIL: 1.0000 "application " | TOPICAL_OIL | Status: DC | PRN

## 2016-12-27 MED ORDER — FENTANYL CITRATE (PF) 100 MCG/2ML IJ SOLN
100.0000 ug | Freq: Once | INTRAMUSCULAR | Status: AC
  Administered 2016-12-27: 100 ug via INTRAVENOUS
  Filled 2016-12-27: qty 2

## 2016-12-27 MED ORDER — SODIUM CHLORIDE 0.9 % IV SOLN
1.0000 g | Freq: Once | INTRAVENOUS | Status: AC
  Administered 2016-12-27: 1 g via INTRAVENOUS
  Filled 2016-12-27: qty 1000

## 2016-12-27 NOTE — Addendum Note (Signed)
Addendum  created 12/27/16 1315 by Shanon PayorSuzanne M Phenix Grein, CRNA   Sign clinical note

## 2016-12-27 NOTE — Addendum Note (Signed)
Addendum  created 12/27/16 0901 by Lowella CurbWarren Ray Shareta Fishbaugh, MD   Anesthesia Intra Meds edited

## 2016-12-27 NOTE — Transfer of Care (Signed)
Immediate Anesthesia Transfer of Care Note  Patient: Christina Cox  Procedure(s) Performed: Procedure(s): CESAREAN SECTION (N/A)  Patient Location: PACU  Anesthesia Type:Epidural  Level of Consciousness: awake  Airway & Oxygen Therapy: Patient Spontanous Breathing  Post-op Assessment: Report given to RN and Post -op Vital signs reviewed and stable  Post vital signs: stable  Last Vitals:  Vitals:   12/26/16 2131 12/26/16 2201  BP: 120/72 125/69  Pulse: (!) 102 98  Resp: 20 20  Temp:      Last Pain:  Vitals:   12/26/16 2101  TempSrc: Axillary  PainSc:       Patients Stated Pain Goal: 2 (12/26/16 1931)  Complications: No apparent anesthesia complications

## 2016-12-27 NOTE — Progress Notes (Signed)
Pt was AROM'd about 10 am and developed fever arounf 4:30pm.  Amp and Gent were started and pt progressed to 6cm.  IUPC was placed for better monitoring of ctx.  Pt was up to 36 and not adequate.  On my recheck of cervical exam, caput was present and cervix felt swollen.  Headremained at -2station.  C/S was discussed and pt ageed to proceed.

## 2016-12-27 NOTE — Anesthesia Postprocedure Evaluation (Signed)
Anesthesia Post Note  Patient: Christina Cox  Procedure(s) Performed: Procedure(s) (LRB): CESAREAN SECTION (N/A)  Patient location during evaluation: PACU Anesthesia Type: Epidural Level of consciousness: oriented and awake and alert Pain management: pain level controlled Vital Signs Assessment: post-procedure vital signs reviewed and stable Respiratory status: spontaneous breathing and respiratory function stable Cardiovascular status: blood pressure returned to baseline and stable Postop Assessment: no headache and no backache Anesthetic complications: no        Last Vitals:  Vitals:   12/27/16 0117 12/27/16 0125  BP: 113/63 109/63  Pulse: 97 (!) 103  Resp: (!) 23 18  Temp:      Last Pain:  Vitals:   12/27/16 0117  TempSrc:   PainSc: 0-No pain   Pain Goal: Patients Stated Pain Goal: 2 (12/26/16 1931)               Lowella CurbWarren Ray Miller

## 2016-12-27 NOTE — Anesthesia Postprocedure Evaluation (Signed)
Anesthesia Post Note  Patient: Francene Boyersshley Lansing  Procedure(s) Performed: Procedure(s) (LRB): CESAREAN SECTION (N/A)  Patient location during evaluation: Mother Baby Anesthesia Type: Epidural Level of consciousness: awake and alert and oriented Pain management: satisfactory to patient Vital Signs Assessment: post-procedure vital signs reviewed and stable Respiratory status: spontaneous breathing and nonlabored ventilation Cardiovascular status: stable Postop Assessment: no headache, no backache, patient able to bend at knees, no signs of nausea or vomiting and adequate PO intake Anesthetic complications: no        Last Vitals:  Vitals:   12/27/16 0500 12/27/16 0900  BP: (!) 113/58 (!) 124/54  Pulse: 83 89  Resp: 18 18  Temp: 36.9 C 36.8 C    Last Pain:  Vitals:   12/27/16 0900  TempSrc: Oral  PainSc: 3    Pain Goal: Patients Stated Pain Goal: 2 (12/26/16 1931)               Madison HickmanGREGORY,Archie Shea

## 2016-12-27 NOTE — Progress Notes (Addendum)
Patient is eating, foley in place, not yet ambulatory.  No flatus.  Pain control is good.  Reports continued itching.  Denies CP/SOB or palpitations, feels sensation that it is sometimes hard to take a deep breath.  No dizziness/lightheadedness.  Feels tired.  Appropriate lochia, no other complaints.  RN reports that when pt is sleeping sats have dropped to 86%.  Vitals:   12/27/16 0444 12/27/16 0450 12/27/16 0500 12/27/16 0900  BP:   (!) 113/58 (!) 124/54  Pulse:   83 89  Resp:   18 18  Temp:   98.5 F (36.9 C) 98.2 F (36.8 C)  TempSrc:   Oral Oral  SpO2: (!) 88% 96% 96% 94%  Weight:      Height:       RRR/CTAB Fundus firm Inc: c/d/i Ext: no CT, +2 pedal edema  Lab Results  Component Value Date   WBC 19.2 (H) 12/27/2016   HGB 9.0 (L) 12/27/2016   HCT 27.7 (L) 12/27/2016   MCV 81.0 12/27/2016   PLT 160 12/27/2016    --/--/A POS, A POS (03/16 0115)  A/P Post op day #1 (short) Delivered 11pm Circ in office Itching- requests IV benadryl, pt states works better than oral Will start daily iron for postop anemia Encourage incentive spirometry.  O2 prn when sleeping.  Routine care.   Philip AspenALLAHAN, Saman Giddens

## 2016-12-27 NOTE — Brief Op Note (Addendum)
12/25/2016 - 12/27/2016  12:35 AM  PATIENT:  Francene BoyersAshley Orren  36 y.o. female  PRE-OPERATIVE DIAGNOSIS:  Cesarean section for arrest of dilation, chorioamnioitis    POST-OPERATIVE DIAGNOSIS:  Cesarean section for arrest of dilation, chorioamnioitis, OP presentation and asynclitic.  PROCEDURE:  Procedure(s): CESAREAN SECTION (N/A)- low transverse  SURGEON:  Surgeon(s) and Role:    * Maxie BetterSheronette Cousins, MD - Assisting    * Philip AspenSidney Lorah Kalina, DO - Primary  ANESTHESIA:   epidural  EBL:  Total I/O In: 2500 [I.V.:2500] Out: 1550 [Urine:850; Blood:700]   FINDINGS: female infant, OP and asynclitic, APGARS 8/9, wt pending.  NOrmal appearing uterus, tubes and ovaries, bladder high on lower uterine segment  SPECIMEN:  Source of Specimen:  placenta  DISPOSITION OF SPECIMEN:  PATHOLOGY  COUNTS:  YES  PLAN OF CARE: Admit to inpatient   PATIENT DISPOSITION:  PACU - hemodynamically stable.   Delay start of Pharmacological VTE agent (>24hrs) due to surgical blood loss or risk of bleeding: not applicable

## 2016-12-28 MED ORDER — OXYCODONE-ACETAMINOPHEN 5-325 MG PO TABS: 1.0000 | ORAL_TABLET | ORAL | 0 refills | Status: DC | PRN

## 2016-12-28 MED ORDER — DOCUSATE SODIUM 100 MG PO CAPS: 100.0000 mg | ORAL_CAPSULE | Freq: Two times a day (BID) | ORAL | 0 refills | Status: DC

## 2016-12-28 MED ORDER — BISACODYL 5 MG PO TBEC
5.0000 mg | DELAYED_RELEASE_TABLET | Freq: Every day | ORAL | Status: DC | PRN
  Administered 2016-12-28: 5 mg via ORAL
  Filled 2016-12-28 (×2): qty 1

## 2016-12-28 MED ORDER — IBUPROFEN 600 MG PO TABS: 600.0000 mg | ORAL_TABLET | Freq: Four times a day (QID) | ORAL | 0 refills | Status: DC | PRN

## 2016-12-28 NOTE — Op Note (Signed)
NAME:  Christina Cox, Christina Cox                 ACCOUNT NO.:  MEDICAL RECORD NO.:  [id number]  LOCATION:                                 FACILITY:  PHYSICIAN:  Philip Aspen, DO    DATE OF BIRTH:  1980/10/15  DATE OF PROCEDURE:  12/26/2016 DATE OF DISCHARGE:                              OPERATIVE REPORT   PREOPERATIVE DIAGNOSIS:  Arrest of dilation and chorioamnionitis.  POSTOPERATIVE DIAGNOSIS:  Arrest of dilation and chorioamnionitis, occiput posterior presentation with asynclitism.  PROCEDURE:  Low transverse cesarean section.  SURGEON:  Philip Aspen, D.O.  ASSISTANT:  Maxie Better, M.D.  ANESTHESIA:  Epidural.  IV FLUIDS:  2500 mL.  URINE OUTPUT:  850 mL.  ESTIMATED BLOOD LOSS:  700 mL.  FINDINGS:  Female infant in OP asynclitic position with Apgars 8 and 9 and weight pending.  Normal-appearing uterus, tubes, and ovaries with bladder noted to be adherent high on the low uterine segment.  SPECIMENS:  Placenta.  COMPLICATIONS:  None.  CONDITION:  Stable to PACU.  DESCRIPTION OF PROCEDURE:  The patient taken to the operating room, where anesthesia was found to be adequate.  She was prepped and draped in the normal sterile fashion in dorsal supine position.  A Pfannenstiel skin incision was made with a scalpel and carried down to underlying layer of fascia with Bovie cautery.  The fascia was incised in the midline with the scalpel and extended laterally with Mayo scissors. Kocher clamps were placed at the superior aspect of the fascial incision and rectus muscles were dissected off bluntly and sharply.  Kocher clamps were then placed at the inferior aspect of the fascial incision and rectus muscles were dissected off bluntly and sharply.  The rectus muscles were separated at the midline with hemostats.  Peritoneum was identified and entered sharply with Metzenbaum scissors.  The incision was extended laterally with gentle traction.  The abdomen was  surveyed manually and found to be normal.  The vesicouterine fold was identified and entered sharply with Metzenbaum scissors and developed digitally.  A low transverse cesarean incision was made with a scalpel and the amniotic sac was entered sharply.  The uterine incision was extended by manual traction.  The infant's head was identified and delivered followed by the remainder of the infant.  The cord was clamped and cut and the infant was handed off to awaiting neonatology.  The placenta was removed with gentle traction on the umbilical cord with manual massage over the uterus.  The uterus was cleared of all clot and debris, and the uterine incision was closed in 2 layers, the first layer was 0 Vicryl in a running locked fashion followed by second layer of horizontal Lembert imbrication.  Excellent hemostasis was noted.  Both tubes and ovaries were normal.  The Alexis self retractor was removed that had been placed after manual palpation of the abdomen after entrance into the peritoneum.  The incision was re-examined and found to be hemostatic. The peritoneum was reapproximated and closed with Monocryl in a running fashion.  The fascia was reapproximated and closed with 0 Vicryl in a running fashion.  The subcutaneous tissue was irrigated, dried and minimal use of  Bovie cautery was needed for hemostasis.  The skin was reapproximated and closed with staples.  Sponge, lap, and needle counts were correct x2.  The patient was taken to recovery in stable condition.          ______________________________ Philip AspenSidney Brooklee Michelin, DO     [place]/MEDQ  D:  12/27/2016  T:  12/27/2016  Job:  [id number]

## 2016-12-28 NOTE — Discharge Summary (Signed)
Obstetric Discharge Summary Reason for Admission: induction of labor Prenatal Procedures: NST and ultrasound Intrapartum Procedures: cesarean: low cervical, transverse Postpartum Procedures: none Complications-Operative and Postpartum: none Hemoglobin  Date Value Ref Range Status  12/27/2016 9.0 (L) 12.0 - 15.0 g/dL Final   HCT  Date Value Ref Range Status  12/27/2016 27.7 (L) 36.0 - 46.0 % Final    Physical Exam:  General: alert, cooperative and appears stated age 1Lochia: appropriate Uterine Fundus: firm Incision: healing well DVT Evaluation: No evidence of DVT seen on physical exam.  Discharge Diagnoses: Term Pregnancy-delivered  Discharge Information: Date: 12/28/2016 Activity: pelvic rest Diet: routine Medications: Ibuprofen, Colace and Percocet Condition: improved Instructions: refer to practice specific booklet Discharge to: home Follow-up Information    CALLAHAN, SIDNEY, DO Follow up.   Specialty:  Obstetrics and Gynecology Contact information: [address] [place] [address] [phone]           Newborn Data: Live born female  Birth Weight: 7 lb 5.6 oz (3335 g) APGAR: 8, 9  Home with mother.  Christina Cox H. 12/28/2016, 10:31 AM

## 2016-12-29 ENCOUNTER — Encounter: Payer: Self-pay | Admitting: Physical Therapy

## 2016-12-29 ENCOUNTER — Encounter (HOSPITAL_COMMUNITY): Payer: Self-pay

## 2017-02-08 ENCOUNTER — Inpatient Hospital Stay (HOSPITAL_COMMUNITY): Admission: AD | Admit: 2017-02-08 | Payer: Self-pay | Source: Ambulatory Visit | Admitting: Obstetrics

## 2017-04-22 NOTE — Addendum Note (Signed)
Addendum  created 04/22/17 1643 by Bartholomew Ramesh Ray, MD   Sign clinical note    

## 2017-04-22 NOTE — Anesthesia Postprocedure Evaluation (Signed)
Anesthesia Post Note  Patient: Christina Cox  Procedure(s) Performed: Procedure(s) (LRB): CESAREAN SECTION (N/A)     Anesthesia Post Evaluation  Last Vitals:  Vitals:   12/27/16 2147 12/28/16 0647  BP: (!) 113/52 112/62  Pulse: 70 78  Resp: 20 19  Temp: 36.6 C 36.6 C    Last Pain:  Vitals:   12/28/16 1439  TempSrc:   PainSc: 5                  [name]

## 2017-10-20 ENCOUNTER — Inpatient Hospital Stay (HOSPITAL_COMMUNITY)
Admission: RE | Admit: 2017-10-20 | Discharge: 2017-10-24 | DRG: 885 | Disposition: A | Discharge status: Home / Self Care | Payer: Medicaid Other | Attending: Psychiatry | Admitting: Psychiatry

## 2017-10-20 ENCOUNTER — Encounter (HOSPITAL_COMMUNITY): Payer: Self-pay

## 2017-10-20 ENCOUNTER — Other Ambulatory Visit: Payer: Self-pay

## 2017-10-20 DIAGNOSIS — G4700 Insomnia, unspecified: Secondary | ICD-10-CM | POA: Diagnosis not present

## 2017-10-20 DIAGNOSIS — Z811 Family history of alcohol abuse and dependence: Secondary | ICD-10-CM | POA: Diagnosis not present

## 2017-10-20 DIAGNOSIS — F419 Anxiety disorder, unspecified: Secondary | ICD-10-CM | POA: Diagnosis not present

## 2017-10-20 DIAGNOSIS — R45851 Suicidal ideations: Secondary | ICD-10-CM | POA: Diagnosis present

## 2017-10-20 DIAGNOSIS — Z915 Personal history of self-harm: Secondary | ICD-10-CM

## 2017-10-20 DIAGNOSIS — F39 Unspecified mood [affective] disorder: Secondary | ICD-10-CM | POA: Diagnosis not present

## 2017-10-20 DIAGNOSIS — Z818 Family history of other mental and behavioral disorders: Secondary | ICD-10-CM | POA: Diagnosis not present

## 2017-10-20 DIAGNOSIS — N390 Urinary tract infection, site not specified: Secondary | ICD-10-CM | POA: Diagnosis not present

## 2017-10-20 DIAGNOSIS — Z560 Unemployment, unspecified: Secondary | ICD-10-CM | POA: Diagnosis not present

## 2017-10-20 DIAGNOSIS — R443 Hallucinations, unspecified: Secondary | ICD-10-CM | POA: Diagnosis present

## 2017-10-20 DIAGNOSIS — F322 Major depressive disorder, single episode, severe without psychotic features: Secondary | ICD-10-CM | POA: Diagnosis present

## 2017-10-20 DIAGNOSIS — F3130 Bipolar disorder, current episode depressed, mild or moderate severity, unspecified: Secondary | ICD-10-CM | POA: Diagnosis not present

## 2017-10-20 DIAGNOSIS — Z87891 Personal history of nicotine dependence: Secondary | ICD-10-CM | POA: Diagnosis not present

## 2017-10-20 DIAGNOSIS — Z9149 Other personal history of psychological trauma, not elsewhere classified: Secondary | ICD-10-CM | POA: Diagnosis not present

## 2017-10-20 MED ORDER — ATENOLOL 25 MG PO TABS
25.0000 mg | ORAL_TABLET | Freq: Every evening | ORAL | Status: DC
  Administered 2017-10-20 – 2017-10-23 (×4): 25 mg via ORAL
  Filled 2017-10-20: qty 7
  Filled 2017-10-20 (×6): qty 1

## 2017-10-20 MED ORDER — LAMOTRIGINE 100 MG PO TABS
100.0000 mg | ORAL_TABLET | Freq: Every evening | ORAL | Status: DC
  Administered 2017-10-20 – 2017-10-23 (×4): 100 mg via ORAL
  Filled 2017-10-20 (×2): qty 1
  Filled 2017-10-20: qty 7
  Filled 2017-10-20 (×4): qty 1

## 2017-10-20 MED ORDER — LURASIDONE HCL 20 MG PO TABS: 20.0000 mg | ORAL_TABLET | Freq: Every day | ORAL | Status: DC

## 2017-10-20 MED ORDER — IMIPRAMINE HCL 50 MG PO TABS
50.0000 mg | ORAL_TABLET | Freq: Every day | ORAL | Status: DC
  Administered 2017-10-20: 50 mg via ORAL
  Filled 2017-10-20: qty 1
  Filled 2017-10-20: qty 2
  Filled 2017-10-20: qty 1

## 2017-10-20 MED ORDER — LURASIDONE HCL 40 MG PO TABS
20.0000 mg | ORAL_TABLET | Freq: Every day | ORAL | Status: DC
  Filled 2017-10-20: qty 1

## 2017-10-20 MED ORDER — DOCUSATE SODIUM 100 MG PO CAPS
100.0000 mg | ORAL_CAPSULE | Freq: Two times a day (BID) | ORAL | Status: DC
  Administered 2017-10-20 – 2017-10-24 (×7): 100 mg via ORAL
  Filled 2017-10-20 (×14): qty 1

## 2017-10-20 MED ORDER — CLONAZEPAM 1 MG PO TABS
1.0000 mg | ORAL_TABLET | Freq: Two times a day (BID) | ORAL | Status: DC
  Administered 2017-10-20 – 2017-10-21 (×2): 1 mg via ORAL
  Filled 2017-10-20 (×3): qty 1

## 2017-10-20 MED ORDER — LURASIDONE HCL 20 MG PO TABS
20.0000 mg | ORAL_TABLET | Freq: Every evening | ORAL | Status: DC
  Filled 2017-10-20: qty 1

## 2017-10-20 MED ORDER — MAGNESIUM HYDROXIDE 400 MG/5ML PO SUSP: 30.0000 mL | Freq: Every day | ORAL | Status: DC | PRN

## 2017-10-20 MED ORDER — LURASIDONE HCL 40 MG PO TABS
20.0000 mg | ORAL_TABLET | Freq: Every evening | ORAL | Status: DC
  Administered 2017-10-20 – 2017-10-22 (×3): 20 mg via ORAL
  Filled 2017-10-20 (×6): qty 1

## 2017-10-20 MED ORDER — LAMOTRIGINE 100 MG PO TABS
100.0000 mg | ORAL_TABLET | Freq: Every day | ORAL | Status: DC
  Filled 2017-10-20 (×3): qty 1

## 2017-10-20 MED ORDER — SERTRALINE HCL 50 MG PO TABS
50.0000 mg | ORAL_TABLET | Freq: Every day | ORAL | Status: DC
  Filled 2017-10-20: qty 1

## 2017-10-20 MED ORDER — SERTRALINE HCL 50 MG PO TABS
50.0000 mg | ORAL_TABLET | Freq: Every day | ORAL | Status: DC
  Filled 2017-10-20 (×2): qty 1

## 2017-10-20 MED ORDER — TOPIRAMATE 25 MG PO CPSP: 100.0000 mg | ORAL_CAPSULE | Freq: Every day | ORAL | Status: DC

## 2017-10-20 MED ORDER — ACETAMINOPHEN 325 MG PO TABS
650.0000 mg | ORAL_TABLET | Freq: Four times a day (QID) | ORAL | Status: DC | PRN
Start: 2017-10-20 — End: 2017-10-24

## 2017-10-20 MED ORDER — SERTRALINE HCL 50 MG PO TABS
50.0000 mg | ORAL_TABLET | Freq: Every day | ORAL | Status: DC
  Administered 2017-10-20: 50 mg via ORAL
  Filled 2017-10-20 (×3): qty 1

## 2017-10-20 MED ORDER — TOPIRAMATE 100 MG PO TABS
100.0000 mg | ORAL_TABLET | Freq: Three times a day (TID) | ORAL | Status: DC
  Administered 2017-10-20: 100 mg via ORAL
  Filled 2017-10-20 (×7): qty 1

## 2017-10-20 MED ORDER — ALUM & MAG HYDROXIDE-SIMETH 200-200-20 MG/5ML PO SUSP: 30.0000 mL | ORAL | Status: DC | PRN

## 2017-10-20 NOTE — Tx Team (Signed)
Initial Treatment Plan 10/20/2017 8:19 PM Christina BoyersAshley Cox [id number]    PATIENT STRESSORS: Financial difficulties Marital or family conflict Other: Birth of son 9.5 months ago.   PATIENT STRENGTHS: Active sense of humor Average or above average intelligence Capable of independent living Communication skills General fund of knowledge Motivation for treatment/growth   PATIENT IDENTIFIED PROBLEMS: Depression  Suicidal ideation  "I would like to be under less stress"  "Get up and out of bed, be able to have the motivation to take a shower"  "To have motivation for daily activities"             DISCHARGE CRITERIA:  Improved stabilization in mood, thinking, and/or behavior Verbal commitment to aftercare and medication compliance  PRELIMINARY DISCHARGE PLAN: Outpatient therapy Medication management  PATIENT/FAMILY INVOLVEMENT: This treatment plan has been presented to and reviewed with the patient, Christina Cox.  The patient and family have been given the opportunity to ask questions and make suggestions.  Levin BaconHeather V Primrose Oler, RN 10/20/2017, 8:19 PM

## 2017-10-20 NOTE — BH Assessment (Signed)
Assessment Note  Christina Cox is an 37 y.o. female. Pt reports SI with no plan. Pt states she thinks about suicide daily. Pt reports 2 previous attempts. Pt denies previous hospitalization. Pt denies HI and AVH. Pt states she is currently receiving therapy and mediation management at [facility]. Pt states he medication was recently changed. Pt reports depressive symptoms. Pt states she is currently separated from her husband and does not feel appreciated. Pt reports Pt states she feels hopeless and "knocked down." Pt states she would like to give up.   Shuvon, NP recommends inpatient treatment. Pt accepted to [facility].   Diagnosis:  F31.4 Bipolar, depression  Past Medical History:  Past Medical History:  Diagnosis Date  . Chronic migraine   . Depression   . Interstitial cystitis   . Wears glasses     Past Surgical History:  Procedure Laterality Date  . BREAST ENHANCEMENT SURGERY Bilateral 2012  . BREAST SURGERY     implants  . BUNIONECTOMY Right age 46  . BUNIONECTOMY    . CESAREAN SECTION N/A 12/26/2016   Procedure: CESAREAN SECTION;  Surgeon: Christina Aspen, DO;  Location: [facility] BIRTHING SUITES;  Service: Obstetrics;  Laterality: N/A;  . CYSTO WITH HYDRODISTENSION N/A 10/13/2013   Procedure: CYSTOSCOPY/HYDRODISTENSION;  Surgeon: Christina Fat, MD;  Location: [facility] ORS;  Service: Urology;  Laterality: N/A;  . INTRAUTERINE DEVICE (IUD) INSERTION  2013   mirena  . WISDOM TOOTH EXTRACTION  age 13    Family History:  Family History  Problem Relation Age of Onset  . Hypertension Father   . Heart attack Father   . Migraines Mother   . Alcohol abuse Mother   . Depression Mother   . Mental illness Mother   . Hyperlipidemia Father   . Depression Maternal Aunt   . Depression Maternal Grandmother   . Cancer Maternal Grandmother        lung cancer-smoker    Social History:  reports that she has quit smoking. she has never used smokeless tobacco. She reports that she does not drink  alcohol or use drugs.  Additional Social History:  Alcohol / Drug Use Pain Medications: please see mar Prescriptions: please see mar Over the Counter: please see mar History of alcohol / drug use?: No history of alcohol / drug abuse Longest period of sobriety (when/how long): NA  CIWA: CIWA-Ar BP: 119/69 Pulse Rate: 81 COWS:    Allergies: No Known Allergies  Home Medications:  Medications Prior to Admission  Medication Sig Dispense Refill  . chlorzoxazone (PARAFON) 500 MG tablet Take by mouth 4 (four) times daily as needed for muscle spasms.    . diazepam (VALIUM) 5 MG tablet Take 5 mg by mouth every 6 (six) hours as needed for anxiety.    . docusate sodium (COLACE) 100 MG capsule Take 1 capsule (100 mg total) by mouth 2 (two) times daily. 60 capsule 0  . HYDROcodone-acetaminophen (NORCO/VICODIN) 5-325 MG per tablet Take 0.5-1 tablets by mouth every 6 (six) hours as needed for severe pain. (Patient not taking: Reported on 07/02/2015) 10 tablet 0  . ibuprofen (ADVIL,MOTRIN) 600 MG tablet Take 1 tablet (600 mg total) by mouth every 6 (six) hours as needed. 90 tablet 0  . imipramine (TOFRANIL) 25 MG tablet Take 50 mg by mouth at bedtime.    Marland Kitchen levonorgestrel (MIRENA) 20 MCG/24HR IUD 1 each by Intrauterine route once.    . nabumetone (RELAFEN) 500 MG tablet Take 500 mg by mouth 2 (two) times daily  as needed.    Marland Kitchen. oxyCODONE (ROXICODONE) 5 MG immediate release tablet Take 1 tablet (5 mg total) by mouth every 4 (four) hours as needed. (Patient not taking: Reported on 07/02/2015) 15 tablet 0  . oxyCODONE-acetaminophen (ROXICET) 5-325 MG tablet Take 1-2 tablets by mouth every 4 (four) hours as needed for severe pain. 30 tablet 0  . Prenatal Vit-Fe Fumarate-FA (PRENATAL MULTIVITAMIN) TABS tablet Take 1 tablet by mouth daily at 12 noon.    . solifenacin (VESICARE) 5 MG tablet Take 5 mg by mouth daily.    Marland Kitchen. topiramate (TOPAMAX) 25 MG capsule Take 25 mg by mouth at bedtime.      OB/GYN Status:  No  LMP recorded.  General Assessment Data [facility] T[facility] Assessment: In system Is this a Tele or Face-to-Face Assessment?: Face-to-Face Is this an Initial Assessment or a Re-assessment for this encounter?: Initial Assessment Marital status: Separated Maiden name: [name] Is patient pregnant?: No Pregnancy Status: No Living Arrangements: Spouse/significant other, Children Can pt return to current living arrangement?: Yes Admission Status: Voluntary Is patient capable of signing voluntary admission?: Yes Referral Source: Self/Family/Friend Insurance type: SP  Medical Screening Exam [facility] Walk-in ONLY) Medical Exam completed: Yes  Crisis Care Plan Living Arrangements: Spouse/significant other, Children Legal Guardian: Other:(self) Name of Psychiatrist: Jovita KussmaulEvans Cox Name of Therapist: Jovita KussmaulEvans Cox  Education Status Is patient currently in school?: No Current Grade: NA Highest grade of school patient has completed: 12 Name of school: NA Contact person: NA  Risk to self with the past 6 months Suicidal Ideation: Yes-Currently Present Has patient been a risk to self within the past 6 months prior to admission? : Yes Suicidal Intent: Yes-Currently Present Has patient had any suicidal intent within the past 6 months prior to admission? : Yes Is patient at risk for suicide?: Yes Suicidal Plan?: No Has patient had any suicidal plan within the past 6 months prior to admission? : No Access to Means: Yes Specify Access to Suicidal Means: access to pills What has been your use of drugs/alcohol within the last 12 months?: NA Previous Attempts/Gestures: Yes How many times?: 2 Other Self Harm Risks: NA Triggers for Past Attempts: Family contact Intentional Self Injurious Behavior: None Family Suicide History: No Recent stressful life event(s): Conflict (Comment), Trauma (Comment) Persecutory voices/beliefs?: No Depression: Yes Depression Symptoms:  Despondent, Insomnia, Tearfulness, Isolating, Fatigue, Guilt, Loss of interest in usual pleasures, Feeling worthless/self pity, Feeling angry/irritable Substance abuse history and/or treatment for substance abuse?: No Suicide prevention information given to non-admitted patients: Not applicable  Risk to Others within the past 6 months Homicidal Ideation: No Does patient have any lifetime risk of violence toward others beyond the six months prior to admission? : No Thoughts of Harm to Others: No Current Homicidal Intent: No Current Homicidal Plan: No Access to Homicidal Means: No Identified Victim: NA History of harm to others?: No Assessment of Violence: None Noted Violent Behavior Description: NA Does patient have access to weapons?: No Criminal Charges Pending?: No Does patient have a court date: No Is patient on probation?: No  Psychosis Hallucinations: None noted Delusions: None noted  Mental Status Report Appearance/Hygiene: Unremarkable Eye Contact: Fair Motor Activity: Freedom of movement Speech: Logical/coherent Level of Consciousness: Alert Mood: Depressed Affect: Depressed Anxiety Level: Moderate Thought Processes: Relevant, Coherent Judgement: Impaired Orientation: Person, Place, Time, Appropriate for developmental age, Situation Obsessive Compulsive Thoughts/Behaviors: None  Cognitive Functioning Concentration: Normal Memory: Recent Intact, Remote Intact IQ: Average Insight: Poor Impulse Control: Poor  Appetite: Fair Weight Loss: 0 Weight Gain: 0 Sleep: Decreased Total Hours of Sleep: 5 Vegetative Symptoms: None  ADLScreening [facility] Assessment Services) Patient's cognitive ability adequate to safely complete daily activities?: Yes Patient able to express need for assistance with ADLs?: Yes Independently performs ADLs?: Yes (appropriate for developmental age)  Prior Inpatient Therapy Prior Inpatient Therapy: No Prior Therapy Dates: NA Prior Therapy  Facilty/Provider(s): NA Reason for Treatment: Na  Prior Outpatient Therapy Prior Outpatient Therapy: Yes Prior Therapy Dates: current Prior Therapy Facilty/Provider(s): Christina Kussmaul Reason for Treatment: bipolar Does patient have an ACCT team?: No Does patient have Intensive In-House Services?  : No Does patient have [facility] services? : No Does patient have P4CC services?: No  ADL Screening (condition at time of admission) Patient's cognitive ability adequate to safely complete daily activities?: Yes Is the patient deaf or have difficulty hearing?: No Does the patient have difficulty seeing, even when wearing glasses/contacts?: No Does the patient have difficulty concentrating, remembering, or making decisions?: No Patient able to express need for assistance with ADLs?: Yes Does the patient have difficulty dressing or bathing?: No Independently performs ADLs?: Yes (appropriate for developmental age) Does the patient have difficulty walking or climbing stairs?: No Weakness of Legs: None Weakness of Arms/Hands: None       Abuse/Neglect Assessment (Assessment to be complete while patient is alone) Abuse/Neglect Assessment Can Be Completed: Yes Physical Abuse: Denies Verbal Abuse: Denies Sexual Abuse: Denies Exploitation of patient/patient's resources: Denies Self-Neglect: Denies     [employer]chant [employer] officer (For Healthcare) Does Patient Have a Medical Advance Directive?: No Would patient like information on creating a medical advance directive?: No - Patient declined    Additional Information 1:1 In Past 12 Months?: No CIRT Risk: No Elopement Risk: No Does patient have medical clearance?: Yes     Disposition:  Disposition Initial Assessment Completed for this Encounter: Yes Disposition of Patient: Inpatient treatment program Type of inpatient treatment program: Adult  On Site Evaluation by:   Reviewed with Physician:    Wolfgang Phoenix D 10/20/2017 5:01 PM
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
## 2017-10-20 NOTE — Progress Notes (Signed)
The patient attended group this evening and was appropriate.  

## 2017-10-20 NOTE — Progress Notes (Signed)
Christina Cox is a 37 year old female being admitted voluntarily to 37 year old female being admitted to 305-2.  [facility] for ongoing depression and suicidal ideation.  She reported poor childhood (mother died when she was 37 years old and her father died when she was [age]), having to stay with various people and being adopted by a "not so good situation."  She reports discord with her husband and they are currently separated.  She had a baby 9 1/2 months ago and is feeling overwhelmed, no motivation and not appreciated by her family.  During [facility], she continues to voice suicidal ideation but will contract for safety on the unit.  Oriented her to the unit.  Admission paperwork completed and signed.  Belongings searched and secured in locker # 21-no contraband found.  Skin assessment completed and no skin issues noted.  Q 15 minute checks initiated for safety.  We will monitor the progress towards her goals.

## 2017-10-20 NOTE — H&P (Signed)
Behavioral Health Medical Screening Exam  Christina Cox is an 37 y.o. female [facility] with complaints of suicidal ideation with no specific plan, history of 2 prior suicide attempts.  Patient is unable to contract for safety and has no support system  Total Time spent with patient: 45 minutes  Psychiatric Specialty Exam: Physical Exam  Vitals reviewed. Constitutional: She is oriented to person, place, and time. She appears well-developed and well-nourished.  HENT:  Head: Normocephalic.  Neck: Normal range of motion. Neck supple.  Cardiovascular: Normal rate and regular rhythm.  Respiratory: Effort normal and breath sounds normal.  Musculoskeletal: Normal range of motion.  Neurological: She is alert and oriented to person, place, and time.  Skin: Skin is warm and dry.    Review of Systems  Psychiatric/Behavioral: Positive for depression and suicidal ideas. Negative for hallucinations and substance abuse. The patient is nervous/anxious.   All other systems reviewed and are negative.   Blood pressure 119/69, pulse 81, temperature (!) 97.3 F (36.3 C), resp. rate 16, SpO2 100 %, unknown if currently breastfeeding.There is no height or weight on file to calculate BMI.  General Appearance: Casual and Neat  Eye Contact:  Good  Speech:  Clear and Coherent and Normal Rate  Volume:  Normal  Mood:  Depressed  Affect:  Depressed, Flat and Tearful  Thought Process:  Coherent and Goal Directed  Orientation:  Full (Time, Place, and Person)  Thought Content:  Denies hallucinations, delusions, and paranoia  Suicidal Thoughts:  Endorses suicidal ideation no specific plan but has been thinking of ways to do it  Homicidal Thoughts:  No  Memory:  Immediate;   Good Recent;   Good Remote;   Good  Judgement:  Impaired  Insight:  Lacking  Psychomotor Activity:  Normal  Concentration: Concentration: Good and Attention Span: Good  Recall:  Good  Fund of Knowledge:Good   Language: Good  Akathisia:  No  Handed:  Right  AIMS (if indicated):     Assets:  Communication Skills Desire for Improvement Housing Physical Health Resilience  Sleep:       Musculoskeletal: Strength & Muscle Tone: within normal limits Gait & Station: normal Patient leans: N/A  Blood pressure 119/69, pulse 81, temperature (!) 97.3 F (36.3 C), resp. rate 16, SpO2 100 %, unknown if currently breastfeeding.  Recommendations:  Inpatient psychiatric treatment; [facility]  Based on my evaluation the patient does not appear to have an emergency medical condition.  Korver Graybeal, NP 10/20/2017, 4:30 PM

## 2017-10-21 DIAGNOSIS — F3130 Bipolar disorder, current episode depressed, mild or moderate severity, unspecified: Principal | ICD-10-CM

## 2017-10-21 DIAGNOSIS — Z9149 Other personal history of psychological trauma, not elsewhere classified: Secondary | ICD-10-CM

## 2017-10-21 LAB — COMPREHENSIVE METABOLIC PANEL
ALT: 14 U/L (ref 14–54)
ANION GAP: 5 (ref 5–15)
AST: 20 U/L (ref 15–41)
Albumin: 3.9 g/dL (ref 3.5–5.0)
Alkaline Phosphatase: 139 U/L — ABNORMAL HIGH (ref 38–126)
BUN: 9 mg/dL (ref 6–20)
CHLORIDE: 111 mmol/L (ref 101–111)
CO2: 22 mmol/L (ref 22–32)
Calcium: 9.1 mg/dL (ref 8.9–10.3)
Creatinine, Ser: 0.9 mg/dL (ref 0.44–1.00)
GFR calc non Af Amer: >60 mL/min (ref >60)
Glucose, Bld: 122 mg/dL — ABNORMAL HIGH (ref 65–99)
Potassium: 3.2 mmol/L — ABNORMAL LOW (ref 3.5–5.1)
SODIUM: 138 mmol/L (ref 135–145)
Total Bilirubin: 0.6 mg/dL (ref 0.3–1.2)
Total Protein: 7.6 g/dL (ref 6.5–8.1)

## 2017-10-21 LAB — URINALYSIS, ROUTINE W REFLEX MICROSCOPIC
Bilirubin Urine: NEGATIVE
Glucose, UA: NEGATIVE mg/dL
HGB URINE DIPSTICK: NEGATIVE
Ketones, ur: NEGATIVE mg/dL
Nitrite: POSITIVE — AB
Protein, ur: NEGATIVE mg/dL
SPECIFIC GRAVITY, URINE: 1.023 (ref 1.005–1.030)
pH: 5 (ref 5.0–8.0)

## 2017-10-21 LAB — CBC
HCT: 43.7 % (ref 36.0–46.0)
Hemoglobin: 14 g/dL (ref 12.0–15.0)
MCH: 27.3 pg (ref 26.0–34.0)
MCHC: 32 g/dL (ref 30.0–36.0)
MCV: 85.2 fL (ref 78.0–100.0)
PLATELETS: 393 10*3/uL (ref 150–400)
RBC: 5.13 MIL/uL — AB (ref 3.87–5.11)
RDW: 15.1 % (ref 11.5–15.5)
WBC: 8.6 10*3/uL (ref 4.0–10.5)

## 2017-10-21 LAB — LIPID PANEL
Cholesterol: 176 mg/dL (ref 0–200)
HDL: 39 mg/dL — AB (ref >40)
LDL Cholesterol: 97 mg/dL (ref 0–99)
TRIGLYCERIDES: 201 mg/dL — AB (ref <150)
Total CHOL/HDL Ratio: 4.5 RATIO
VLDL: 40 mg/dL (ref 0–40)

## 2017-10-21 LAB — RAPID URINE DRUG SCREEN, HOSP PERFORMED
Amphetamines: NOT DETECTED
BENZODIAZEPINES: POSITIVE — AB
Barbiturates: NOT DETECTED
Cocaine: NOT DETECTED
Opiates: NOT DETECTED
Tetrahydrocannabinol: NOT DETECTED

## 2017-10-21 LAB — URINALYSIS, COMPLETE (UACMP) WITH MICROSCOPIC
Bilirubin Urine: NEGATIVE
Glucose, UA: NEGATIVE mg/dL
HGB URINE DIPSTICK: NEGATIVE
Ketones, ur: NEGATIVE mg/dL
Nitrite: NEGATIVE
PROTEIN: NEGATIVE mg/dL
Specific Gravity, Urine: 1.025 (ref 1.005–1.030)
pH: 6 (ref 5.0–8.0)

## 2017-10-21 LAB — ETHANOL

## 2017-10-21 LAB — HEMOGLOBIN A1C
Hgb A1c MFr Bld: 5.1 % (ref 4.8–5.6)
MEAN PLASMA GLUCOSE: 99.67 mg/dL

## 2017-10-21 LAB — TSH: TSH: <0.01 u[IU]/mL — ABNORMAL LOW (ref 0.350–4.500)

## 2017-10-21 LAB — T4, FREE: FREE T4: 0.78 ng/dL (ref 0.61–1.12)

## 2017-10-21 LAB — PREGNANCY, URINE: Preg Test, Ur: NEGATIVE

## 2017-10-21 MED ORDER — TOPIRAMATE 100 MG PO TABS
100.0000 mg | ORAL_TABLET | Freq: Two times a day (BID) | ORAL | Status: DC
  Administered 2017-10-21 – 2017-10-24 (×6): 100 mg via ORAL
  Filled 2017-10-21 (×2): qty 1
  Filled 2017-10-21 (×2): qty 14
  Filled 2017-10-21 (×5): qty 1

## 2017-10-21 MED ORDER — POTASSIUM CHLORIDE CRYS ER 10 MEQ PO TBCR
10.0000 meq | EXTENDED_RELEASE_TABLET | Freq: Two times a day (BID) | ORAL | Status: AC
  Administered 2017-10-21 – 2017-10-22 (×3): 10 meq via ORAL
  Filled 2017-10-21 (×3): qty 1
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
## 2017-10-21 NOTE — Progress Notes (Signed)
Patient ID: Christina Cox, female   DOB: Nov 06, 1980, 37 y.o.   MRN: [id number]  Christina Cox received a urine specimen cup and asked to provide a urine sample. Patient has not returned with specimen at this time.

## 2017-10-21 NOTE — Progress Notes (Signed)
[facility] Group Notes:  (Nursing/MHT/Case Management/Adjunct)  Date:  10/21/2017  Time:  0830 Type of Therapy:  Nurse Education  Participation Level:  Active  Participation Quality:  Appropriate  Affect:  Appropriate  Cognitive:  Alert and Oriented  Insight:  Appropriate  Engagement in Group:  Engaged  Modes of Intervention:  Activity, Discussion, Education, Socialization and Support  Summary of Progress/Problems:The purpose of this group is to educate and introduce patients to the benefits of aromatherapy. Pt participated and was engaged in group.  Beatrix ShipperWright, Deania Siguenza Martin 10/21/2017, 4:37 PM

## 2017-10-21 NOTE — Progress Notes (Signed)
Patient ID: Christina Cox, female   DOB: 10-15-1980, 37 y.o.   MRN: [id number]  DAR: Pt. Denies SI/HI and A/V Hallucinations. She reports that her sleep last night is poor, her appetite is fair, and her energy level is low. Patient does not report any pain or discomfort at this time. Support and encouragement provided to the patient. Scheduled medications refused by patient this morning however she did accept them this evening. Patient is receptive and cooperative. She is seen in the milieu intermittently. She reports she has experienced some diarrhea this afternoon and therefore retreated to her room. A urine sample was obtained from patient. Q15 minute checks are maintained for safety.

## 2017-10-21 NOTE — [facility] Counselor (Signed)
Adult Comprehensive Assessment  Patient ID: Christina Cox, female   DOB: 05/22/1981, 37 y.o.   MRN: [id number]  Information Source: Information source: Patient  Current Stressors:  Family Relationships: Patient has become more distant from her twin brother, reporting they were close growing up, but since he has been married he has distanced himself.  Thus lacking support.  Patient has also separated from her husband of 11 years.  Living/Environment/Situation:  Living Arrangements: Spouse/significant other Living conditions (as described by patient or guardian): Patient reports it has been difficult and emotional as they have been married for 11 years.  Husband remains in the home along with 2 children, but patient lacks support and emotional connection to husband How long has patient lived in current situation?: 11 years What is atmosphere in current home: Temporary  Family History:  Marital status: Separated Separated, when?: Last year What types of issues is patient dealing with in the relationship?: Patient reports she is unable to answer this question and still trying to understand and work through the issues. Additional relationship information: Patient was married one other time when she was very young. She still has positive relationship with ex-husband and his wife.  They share a 42 year old son together. Are you sexually active?: No Does patient have children?: Yes How many children?: 2 How is patient's relationship with their children?: Patient has a 54 year old son and a 27 month old son Both live in her home.  Childhood History:  By whom was/is the patient raised?: Mother, Father, Other (Comment) Additional childhood history information: Patient's mother and father passed away when she was a teenager. Mother died of alcohol and father had a heart attack at work.   Patient reports she was orphaned and went to live with other family.  [place] Description of patient's relationship with caregiver when they were a child: patient was close to mother and father.  Her other living situation was not a good situation and she has no contact with any family Patient's description of current relationship with people who raised him/her: none, deceased Does patient have siblings?: Yes Number of Siblings: 1 Description of patient's current relationship with siblings: twin brother.  Little to no contact.   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: No Did You Receive Any Psychiatric Treatment/Services While in Equities trader?: No Are There Guns or Other Weapons in Your Home?: No Are These Weapons Safely Secured?: No  Financial Resources:   Financial resources:  Income from spouse, Medicaid, Dolores LoryReceives SSDI Does patient have a representative payee or guardian?: No  Alcohol/Substance Abuse:    What has been your use of drugs/alcohol within the last 12 months?: None reported If attempted suicide, did drugs/alcohol play a role in this?: No Alcohol/Substance Abuse Treatment Hx: Denies past history Has alcohol/substance abuse ever caused legal problems?: No  Social Support System:   Patient's Community Support System: Fair Describe Community Support System: Patient reports her biggest issues lack of support with new baby and managing chronic mental health as well.  Reports ex husband is supportive but she does not talk to him like that. Type of faith/religion: none reported How does patient's faith help to cope with current illness?: NA  Leisure/Recreation:   Leisure and Hobbies: "I really enjoy working out and feeling healthy".  I want to get back to working out.  Strengths/Needs:   What things does the patient do well?: I am a good mother In what areas does patient struggle / problems for patient: depression/anxiety  Discharge Plan:   Does patient have access to transportation?: Yes Will patient be returning to same living situation after discharge?: Yes Currently receiving community mental health services: Yes (From Whom)([facility]) Does patient have financial barriers related to discharge medications?: No  Summary/Recommendations:   Summary and Recommendations (to be completed by the evaluator): My therapist said I should come here. I told her about my suicidal thoughts for the last several days. I feel worthless helpless, cant sleep cant eat, lifeless. Everything is beginning to build up into 1 and it has built up for so long that I cant deal with it anymore. I cant deal with the pressure of controlling my own life emotions and I have no support group at this time. My biggest stressors are not having any support groups, poor relationships and mental illness.    Patient plans to return home to current situation. I discussed outpatient care with [facility]ul[facility] and she will  resume therapy with her therapist Christina Cox and medication management.  Patient also referred to local community group: [id number] to meet her goal of resuming working out and incorporating baby.  Patient was receptive and agreeable.   Christina Cox, Christina Monda N. 10/21/2017

## 2017-10-21 NOTE — Progress Notes (Signed)
Pt is new to the unit this evening shortly before shift change.  She reports that she has been having thoughts of suicide lately and feels her meds are not right.  She contracts for safety.  She does not have any substance abuse issues.  Pt has been pleasant and cooperative, and has been very patient with staff in getting her medication orders addressed for the evening.  She was ok with waiting until morning to talk with the doctor.  She said as long as she had something to help her sleep, then she would be ok.  Support and encouragement offered.  Discharge plans are in process.  Safety maintained with q15 minute checks.

## 2017-10-21 NOTE — [facility] Suicide Risk Assessment (Signed)
[facility] INPATIENT:  Family/Significant Other Suicide Prevention Education  Suicide Prevention Education:  Patient Refusal for Family/Significant Other Suicide Prevention Education: The patient Christina Cox has refused to provide written consent for family/significant other to be provided Family/Significant Other Suicide Prevention Education during admission and/or prior to discharge.  Physician notified.  Raye SorrowCoble, Christina Cox 10/21/2017, 9:59 AM

## 2017-10-21 NOTE — [facility] Group Notes (Signed)
LCSW Group Therapy Note   10/21/2017 1:15pm   Type of Therapy and Topic:  Group Therapy:  Overcoming Obstacles   Participation Level:  Did Not Attend-pt chose to remain in bed. (pt programming on 400 hall).    Description of Group:    In this group patients will be encouraged to explore what they see as obstacles to their own wellness and recovery. They will be guided to discuss their thoughts, feelings, and behaviors related to these obstacles. The group will process together ways to cope with barriers, with attention given to specific choices patients can make. Each patient will be challenged to identify changes they are motivated to make in order to overcome their obstacles. This group will be process-oriented, with patients participating in exploration of their own experiences as well as giving and receiving support and challenge from other group members.   Therapeutic Goals: 1. Patient will identify personal and current obstacles as they relate to admission. 2. Patient will identify barriers that currently interfere with their wellness or overcoming obstacles.  3. Patient will identify feelings, thought process and behaviors related to these barriers. 4. Patient will identify two changes they are willing to make to overcome these obstacles:      Summary of Patient Progress   x   Therapeutic Modalities:   Cognitive Behavioral Therapy Solution Focused Therapy Motivational Interviewing Relapse Prevention Therapy  Ledell PeoplesHeather N Smart, LCSW 10/21/2017 2:46 PM

## 2017-10-22 DIAGNOSIS — F39 Unspecified mood [affective] disorder: Secondary | ICD-10-CM

## 2017-10-22 DIAGNOSIS — N390 Urinary tract infection, site not specified: Secondary | ICD-10-CM

## 2017-10-22 DIAGNOSIS — G4700 Insomnia, unspecified: Secondary | ICD-10-CM

## 2017-10-22 LAB — T3, FREE: T3, Free: 3.2 pg/mL (ref 2.0–4.4)

## 2017-10-22 MED ORDER — ATENOLOL 25 MG PO TABS: 25.0000 mg | ORAL_TABLET | Freq: Every evening | ORAL | 0 refills | Status: DC

## 2017-10-22 MED ORDER — TOPIRAMATE 100 MG PO TABS: 100.0000 mg | ORAL_TABLET | Freq: Two times a day (BID) | ORAL | 0 refills | Status: DC

## 2017-10-22 MED ORDER — CLONAZEPAM 1 MG PO TABS: 1.0000 mg | ORAL_TABLET | Freq: Two times a day (BID) | ORAL | 0 refills | Status: DC

## 2017-10-22 MED ORDER — LURASIDONE HCL 20 MG PO TABS: 20.0000 mg | ORAL_TABLET | Freq: Every evening | ORAL | 0 refills | Status: DC

## 2017-10-22 MED ORDER — HYDROXYZINE HCL 25 MG PO TABS
25.0000 mg | ORAL_TABLET | Freq: Three times a day (TID) | ORAL | Status: DC | PRN
Start: 2017-10-22 — End: 2017-10-24
  Administered 2017-10-23: 25 mg via ORAL
  Filled 2017-10-22: qty 1
  Filled 2017-10-22: qty 10

## 2017-10-22 MED ORDER — CLONAZEPAM 1 MG PO TABS
1.0000 mg | ORAL_TABLET | Freq: Two times a day (BID) | ORAL | Status: DC | PRN
  Administered 2017-10-22 – 2017-10-23 (×2): 1 mg via ORAL
  Filled 2017-10-22 (×3): qty 1

## 2017-10-22 MED ORDER — NITROFURANTOIN MONOHYD MACRO 100 MG PO CAPS
100.0000 mg | ORAL_CAPSULE | Freq: Two times a day (BID) | ORAL | Status: DC
  Administered 2017-10-22 – 2017-10-24 (×5): 100 mg via ORAL
  Filled 2017-10-22 (×4): qty 1
  Filled 2017-10-22 (×2): qty 8
  Filled 2017-10-22 (×4): qty 1

## 2017-10-22 MED ORDER — LAMOTRIGINE 100 MG PO TABS: 100.0000 mg | ORAL_TABLET | Freq: Every evening | ORAL | 0 refills | Status: DC

## 2017-10-22 NOTE — Progress Notes (Signed)
Pt has been observed sitting in the dayroom this evening watching TV and talking with other patients.  She reports that her day has been ok.  She says she is still depressed, but not having any suicidal thoughts.  She denies HI/AVH.  She is still somewhat frustrated that her meds are not all scheduled for bedtime as she takes them at home, but she is understanding of how meds are ordered in the hospital, and is agreeable to take them as ordered.  She has been pleasant and cooperative.  Pt makes her needs known to staff.  Support and encouragement offered.  Discharge plans are in process.  Safety maintained with q15 minute checks.

## 2017-10-22 NOTE — Progress Notes (Signed)
D: Patient observed up and visible in the dayroom. Interacting with peers and staff appropriately. Patient's affect anxious, mood congruent. Per self inventory and discussions with writer, rates depression, hopelessness and anxiety all at a 6/10. Rates sleep as fair, appetite as fair, energy as normal and concentration as good.  States goal for today is "my bipolar and severe depression. Try to be more motivated." Denies pain, physical complaints.   A: Medicated per orders, no prns requested or required. Level III obs in place for safety. Emotional support offered and self inventory reviewed. Encouraged completion of Suicide Safety Plan and programming participation. Discussed POC with MD, SW.   R: Patient verbalizes understanding of POC. Patient denies SI/HI/AVH and remains safe on level III obs. Will continue to monitor closely and make verbal contact frequently.

## 2017-10-22 NOTE — Progress Notes (Signed)
Recreation Therapy Notes  Date:  10/22/17 Time: 0930 Location: 300 Hall Group Room  Group Topic: Stress Management  Goal Area(s) Addresses:  Patient will verbalize importance of using healthy stress management.  Patient will identify positive emotions associated with healthy stress management.  Intervention: Stress Management  Activity :  Progressive Muscle Relaxation.  LRT introduced the stress management technique of progressive muscle relaxation.  LRT read a script to guide patients through the technique which allowed them to tense and relax each muscle group individually.  Education: Stress Management, Discharge Planning.   Education Outcome: Acknowledges edcuation/In group clarification offered/Needs additional education  Clinical Observations/Feedback: Pt did not attend group.     Sundae Maners, LRT/CTRS          Kyngston Pickelsimer A 10/22/2017 12:10 PM 

## 2017-10-22 NOTE — Progress Notes (Signed)
[facility] ([place] MD Progress Note  10/22/2017 12:49 PM Christina Cox  MRN:  [id number] Subjective:  Christina Cox reports " I am doing okay, I just have question about my medications."  Objective: Claude Swendsen is awake, alert and oriented. Seen resting in dayroom interacting with peers.  Denies suicidal or homicidal ideation. Denies auditory or visual hallucination and does not appear to be responding to internal stimuli. Reports her mood and anxiety is improving.  Christina Cox is requesting to take Klonopin 2 mg at night to help with with her insomnia. Discussed medications adjustment will make Klonopin 1 mg. PRN and may use Vistaril 25 mg PRN for increased anxiety or panic attacks. Reports taken klonopin schedule cause her to be to sleepy during the day. Discussed UTI/symptoms. Patient to start [place].   Patient reports she is medication compliant without mediation side effects.States her depression 7/10. Reports she is mainly experiencing  headaches due to her Topamax getting decreased from 300 mg to 200 mg  Reports good appetite  and resting well. Support, encouragement and reassurance was provided.   Principal Problem: Bipolar I disorder, most recent episode depressed ([place]) Diagnosis:   Patient Active Problem List   Diagnosis Date Noted  . Bipolar I disorder, most recent episode depressed ([place]) [F31.30] 10/20/2017  . MDD (major depressive disorder), severe ([place]) [F32.2] 10/20/2017  . Advanced maternal age in multigravida [O09.529] 12/25/2016  . Migraine without aura [G43.009] 06/13/2013  . Depression [F32.9] 06/13/2013  . Anxiety [F41.9] 06/13/2013  . Insomnia [G47.00] 06/13/2013  . Chronic migraine [G43.709] 06/13/2013   Total Time spent with patient: 30 minutes  Past Psychiatric History:   Past Medical History:  Past Medical History:  Diagnosis Date  . Chronic migraine   . Depression   . Interstitial cystitis   . Wears glasses     Past Surgical History:  Procedure Laterality Date  .  BREAST ENHANCEMENT SURGERY Bilateral 2012  . BREAST SURGERY     implants  . BUNIONECTOMY Right age 42  . BUNIONECTOMY    . CESAREAN SECTION N/A 12/26/2016   Procedure: CESAREAN SECTION;  Surgeon: Allyn Kenner, DO;  Location: [facility];  Service: Obstetrics;  Laterality: N/A;  . CYSTO WITH HYDRODISTENSION N/A 10/13/2013   Procedure: CYSTOSCOPY/HYDRODISTENSION;  Surgeon: Ardis Hughs, MD;  Location: [facility];  Service: Urology;  Laterality: N/A;  . INTRAUTERINE DEVICE (IUD) INSERTION  2013   mirena  . WISDOM TOOTH EXTRACTION  age 25   Family History:  Family History  Problem Relation Age of Onset  . Hypertension Father   . Heart attack Father   . Migraines Mother   . Alcohol abuse Mother   . Depression Mother   . Mental illness Mother   . Hyperlipidemia Father   . Depression Maternal Aunt   . Depression Maternal Grandmother   . Cancer Maternal Grandmother        lung cancer-smoker   Family Psychiatric  History: Social History:  Social History   Substance and Sexual Activity  Alcohol Use No     Social History   Substance and Sexual Activity  Drug Use No    Social History   Socioeconomic History  . Marital status: Single    Spouse name: None  . Number of children: None  . Years of education: None  . Highest education level: None  Social Needs  . Financial resource strain: None  . Food insecurity - worry: None  . Food insecurity - inability: None  . Transportation needs - medical: None  .  Transportation needs - non-medical: None  Occupational History  . None  Tobacco Use  . Smoking status: Former Research scientist (life sciences)  . Smokeless tobacco: Never Used  Substance and Sexual Activity  . Alcohol use: No  . Drug use: No  . Sexual activity: None  Other Topics Concern  . None  Social History Narrative   ** Merged History Encounter **       Additional Social History:    Pain Medications: please see mar Prescriptions: please see mar Over the Counter: please  see mar History of alcohol / drug use?: No history of alcohol / drug abuse Longest period of sobriety (when/how long): NA                    Sleep: Fair  Appetite:  Fair  Current Medications: Current Facility-Administered Medications  Medication Dose Route Frequency Provider Last Rate Last Dose  . acetaminophen (TYLENOL) tablet 650 mg  650 mg Oral Q6H PRN Rankin, Shuvon B, NP      . alum & mag hydroxide-simeth (MAALOX/MYLANTA) 200-200-20 MG/5ML suspension 30 mL  30 mL Oral Q4H PRN Rankin, Shuvon B, NP      . atenolol (TENORMIN) tablet 25 mg  25 mg Oral QPM Simon, Spencer E, PA-C   25 mg at 10/21/17 1730  . clonazePAM (KLONOPIN) tablet 1 mg  1 mg Oral BID PRN Derrill Center, NP      . docusate sodium (COLACE) capsule 100 mg  100 mg Oral BID Rankin, Shuvon B, NP   100 mg at 10/22/17 0825  . hydrOXYzine (ATARAX/VISTARIL) tablet 25 mg  25 mg Oral TID PRN Derrill Center, NP      . lamoTRIgine (LAMICTAL) tablet 100 mg  100 mg Oral QPM Patriciaann Clan E, PA-C   100 mg at 10/21/17 1730  . lurasidone (LATUDA) tablet 20 mg  20 mg Oral QPM Hampton Abbot, MD   20 mg at 10/21/17 1730  . magnesium hydroxide (MILK OF MAGNESIA) suspension 30 mL  30 mL Oral Daily PRN Rankin, Shuvon B, NP      . nitrofurantoin (macrocrystal-monohydrate) (MACROBID) capsule 100 mg  100 mg Oral Q12H Derrill Center, NP   100 mg at 10/22/17 1211  . potassium chloride (K-DUR,KLOR-CON) CR tablet 10 mEq  10 mEq Oral BID Cobos, Myer Peer, MD   10 mEq at 10/22/17 0825  . topiramate (TOPAMAX) tablet 100 mg  100 mg Oral BID Cobos, Myer Peer, MD   100 mg at 10/22/17 0825    Lab Results:  Results for orders placed or performed during the hospital encounter of 10/20/17 (from the past 48 hour(s))  Urinalysis, Complete w Microscopic     Status: Abnormal   Collection Time: 10/20/17  9:24 PM  Result Value Ref Range   Color, Urine YELLOW YELLOW   APPearance CLOUDY (A) CLEAR   Specific Gravity, Urine 1.025 1.005 - 1.030   pH  6.0 5.0 - 8.0   Glucose, UA NEGATIVE NEGATIVE mg/dL   Hgb urine dipstick NEGATIVE NEGATIVE   Bilirubin Urine NEGATIVE NEGATIVE   Ketones, ur NEGATIVE NEGATIVE mg/dL   Protein, ur NEGATIVE NEGATIVE mg/dL   Nitrite NEGATIVE NEGATIVE   Leukocytes, UA MODERATE (A) NEGATIVE   RBC / HPF 0-5 0 - 5 RBC/hpf   WBC, UA TOO NUMEROUS TO COUNT 0 - 5 WBC/hpf   Bacteria, UA FEW (A) NONE SEEN   Squamous Epithelial / LPF 6-30 (A) NONE SEEN   Mucus PRESENT    Amorphous  Crystal PRESENT    Ca Oxalate Crys, UA PRESENT     Comment: Performed at [facility], [place] [address], [place], [place] [address]  Pregnancy, urine     Status: None   Collection Time: 10/20/17  9:24 PM  Result Value Ref Range   Preg Test, Ur NEGATIVE NEGATIVE    Comment:        THE SENSITIVITY OF THIS METHODOLOGY IS >20 mIU/mL. Performed at [facility], [place] [address], [place], [place] [address]   Urine rapid drug screen (hosp performed)not at [facility]     Status: Abnormal   Collection Time: 10/20/17  9:24 PM  Result Value Ref Range   Opiates NONE DETECTED NONE DETECTED   Cocaine NONE DETECTED NONE DETECTED   Benzodiazepines POSITIVE (A) NONE DETECTED   Amphetamines NONE DETECTED NONE DETECTED   Tetrahydrocannabinol NONE DETECTED NONE DETECTED   Barbiturates NONE DETECTED NONE DETECTED    Comment: (NOTE) DRUG SCREEN FOR MEDICAL PURPOSES ONLY.  IF CONFIRMATION IS NEEDED FOR ANY PURPOSE, NOTIFY LAB WITHIN 5 DAYS. LOWEST DETECTABLE LIMITS FOR URINE DRUG SCREEN Drug Class                     Cutoff (ng/mL) Amphetamine and metabolites    1000 Barbiturate and metabolites    200 Benzodiazepine                 846 Tricyclics and metabolites     300 Opiates and metabolites        300 Cocaine and metabolites        300 THC                            50 Performed at [facility], [place] [address], [place], [place] [address]   CBC     Status: Abnormal   Collection Time: 10/21/17   7:39 AM  Result Value Ref Range   WBC 8.6 4.0 - 10.5 K/uL   RBC 5.13 (H) 3.87 - 5.11 MIL/uL   Hemoglobin 14.0 12.0 - 15.0 g/dL   HCT 43.7 36.0 - 46.0 %   MCV 85.2 78.0 - 100.0 fL   MCH 27.3 26.0 - 34.0 pg   MCHC 32.0 30.0 - 36.0 g/dL   RDW 15.1 11.5 - 15.5 %   Platelets 393 150 - 400 K/uL    Comment: Performed at [facility], [place] [address] [place], [place] [address]  Comprehensive metabolic panel     Status: Abnormal   Collection Time: 10/21/17  7:39 AM  Result Value Ref Range   Sodium 138 135 - 145 mmol/L   Potassium 3.2 (L) 3.5 - 5.1 mmol/L   Chloride 111 101 - 111 mmol/L   CO2 22 22 - 32 mmol/L   Glucose, Bld 122 (H) 65 - 99 mg/dL   BUN 9 6 - 20 mg/dL   Creatinine, Ser 0.90 0.44 - 1.00 mg/dL   Calcium 9.1 8.9 - 10.3 mg/dL   Total Protein 7.6 6.5 - 8.1 g/dL   Albumin 3.9 3.5 - 5.0 g/dL   AST 20 15 - 41 U/L   ALT 14 14 - 54 U/L   Alkaline Phosphatase 139 (H) 38 - 126 U/L   Total Bilirubin 0.6 0.3 - 1.2 mg/dL   GFR calc non Af Amer >60 >60 mL/min   GFR calc Af Amer >60 >60 mL/min    Comment: (NOTE) The  eGFR has been calculated using the CKD EPI equation. This calculation has not been validated in all clinical situations. eGFR's persistently <60 mL/min signify possible Chronic Kidney Disease.    Anion gap 5 5 - 15    Comment: Performed at [facility], [place] [address], [place], [place] [address]  Hemoglobin A1c     Status: None   Collection Time: 10/21/17  7:39 AM  Result Value Ref Range   Hgb A1c MFr Bld 5.1 4.8 - 5.6 %    Comment: (NOTE) Pre diabetes:          5.7%-6.4% Diabetes:              >6.4% Glycemic control for   <7.0% adults with diabetes    Mean Plasma Glucose 99.67 mg/dL    Comment: Performed at [place] [address] [place], [place] [address]  Ethanol     Status: None   Collection Time: 10/21/17  7:39 AM  Result Value Ref Range   Alcohol, Ethyl (B) <10 <10 mg/dL    Comment:        LOWEST  DETECTABLE LIMIT FOR SERUM ALCOHOL IS 10 mg/dL FOR MEDICAL PURPOSES ONLY Performed at [place] [address], [place], [place] [address]   Lipid panel     Status: Abnormal   Collection Time: 10/21/17  7:39 AM  Result Value Ref Range   Cholesterol 176 0 - 200 mg/dL   Triglycerides 201 (H) <150 mg/dL   HDL 39 (L) >40 mg/dL   Total CHOL/HDL Ratio 4.5 RATIO   VLDL 40 0 - 40 mg/dL   LDL Cholesterol 97 0 - 99 mg/dL    Comment:        Total Cholesterol/HDL:CHD Risk Coronary Heart Disease Risk Table                     Men   Women  1/2 Average Risk   3.4   3.3  Average Risk       5.0   4.4  2 X Average Risk   9.6   7.1  3 X Average Risk  23.4   11.0        Use the calculated Patient Ratio above and the CHD Risk Table to determine the patient's CHD Risk.        ATP III CLASSIFICATION (LDL):  <100     mg/dL   Optimal  100-129  mg/dL   Near or Above                    Optimal  130-159  mg/dL   Borderline  160-189  mg/dL   High  >190     mg/dL   Very High Performed at [place] [address] [place], [place] [address]   TSH     Status: Abnormal   Collection Time: 10/21/17  7:39 AM  Result Value Ref Range   TSH <0.010 (L) 0.350 - 4.500 uIU/mL    Comment: Performed by a 3rd Generation assay with a functional sensitivity of <=0.01 uIU/mL. Performed at [facility], [place] [address] [place], [place] [address]   Urinalysis, Routine w reflex microscopic     Status: Abnormal   Collection Time: 10/21/17  4:25 PM  Result Value Ref Range   Color, Urine AMBER (A) YELLOW    Comment: BIOCHEMICALS MAY BE AFFECTED BY COLOR   APPearance TURBID (A) CLEAR  Specific Gravity, Urine 1.023 1.005 - 1.030   pH 5.0 5.0 - 8.0   Glucose, UA NEGATIVE NEGATIVE mg/dL   Hgb urine dipstick NEGATIVE NEGATIVE   Bilirubin Urine NEGATIVE NEGATIVE   Ketones, ur NEGATIVE NEGATIVE mg/dL   Protein, ur NEGATIVE NEGATIVE mg/dL   Nitrite POSITIVE  (A) NEGATIVE   Leukocytes, UA MODERATE (A) NEGATIVE   RBC / HPF 0-5 0 - 5 RBC/hpf   WBC, UA TOO NUMEROUS TO COUNT 0 - 5 WBC/hpf   Bacteria, UA MANY (A) NONE SEEN   Squamous Epithelial / LPF 6-30 (A) NONE SEEN   Mucus PRESENT    Amorphous Crystal PRESENT     Comment: Performed at [facility], [place] [address], [place], [place] [address]  T3, free     Status: None   Collection Time: 10/21/17  6:25 PM  Result Value Ref Range   T3, Free 3.2 2.0 - 4.4 pg/mL    Comment: (NOTE) Performed At: [facility] [place], [place] [id number] Rush Farmer MD [id number] Performed at [facility], [place] [address] [place], [place] [address]   T4, free     Status: None   Collection Time: 10/21/17  6:25 PM  Result Value Ref Range   Free T4 0.78 0.61 - 1.12 ng/dL    Comment: (NOTE) Biotin ingestion may interfere with free T4 tests. If the results are inconsistent with the TSH level, previous test results, or the clinical presentation, then consider biotin interference. If needed, order repeat testing after stopping biotin. Performed at [facility], [place] [address], [place], [place] [address]     Blood Alcohol level:  Lab Results  Component Value Date   ETH <10 65/46/5035    Metabolic Disorder Labs: Lab Results  Component Value Date   HGBA1C 5.1 10/21/2017   MPG 99.67 10/21/2017   No results found for: PROLACTIN Lab Results  Component Value Date   CHOL 176 10/21/2017   TRIG 201 (H) 10/21/2017   HDL 39 (L) 10/21/2017   CHOLHDL 4.5 10/21/2017   VLDL 40 10/21/2017   LDLCALC 97 10/21/2017    Physical Findings: AIMS: Facial and Oral Movements Muscles of Facial Expression: None, normal Lips and Perioral Area: None, normal Jaw: None, normal Tongue: None, normal,Extremity Movements Upper (arms, wrists, hands, fingers): None, normal Lower (legs, knees, ankles, toes): None, normal, Trunk Movements Neck, shoulders,  hips: None, normal, Overall Severity Severity of abnormal movements (highest score from questions above): None, normal Incapacitation due to abnormal movements: None, normal Patient's awareness of abnormal movements (rate only patient's report): No Awareness, Dental Status Current problems with teeth and/or dentures?: No Does patient usually wear dentures?: No  CIWA:    COWS:     Musculoskeletal: Strength & Muscle Tone: within normal limits Gait & Station: normal Patient leans: N/A  Psychiatric Specialty Exam: Physical Exam  Nursing note and vitals reviewed. Constitutional: She is oriented to person, place, and time. She appears well-developed.  Cardiovascular: Normal rate.  Neurological: She is alert and oriented to person, place, and time.  Psychiatric: She has a normal mood and affect. Her behavior is normal.    Review of Systems  Psychiatric/Behavioral: Positive for depression. Negative for suicidal ideas. The patient is not nervous/anxious.     Blood pressure 107/65, pulse 74, temperature 97.6 F (36.4 C), resp. rate 16, height '5\' 5"'$  (1.651 m), weight 70.3 kg (155 lb), SpO2 100 %, unknown if currently breastfeeding.Body mass index is 25.79 kg/m.  General Appearance: Casual  Eye Contact:  Fair  Speech:  Clear and Coherent  Volume:  Normal  Mood:  Anxious and Depressed  Affect:  Congruent  Thought Process:  Coherent  Orientation:  Full (Time, Place, and Person)  Thought Content:  Hallucinations: None  Suicidal Thoughts:  No  Homicidal Thoughts:  No  Memory:  NA  Judgement:  Fair  Insight:  Fair  Psychomotor Activity:  Normal  Concentration:  Concentration: Fair  Recall:  [place] of Knowledge:  Good  Language:  Good  Akathisia:  No  Handed:  Right  AIMS (if indicated):     Assets:  Communication Skills Desire for Improvement Resilience Social Support  ADL's:  Intact  Cognition:  WNL  Sleep:  Number of Hours: 6     Treatment Plan Summary: Daily contact  with patient to assess and evaluate symptoms and progress in treatment and Medication management   Continue with current treatment plan on 10/22/2017 listed below except where noted  Mood stabilization:   Continue with Lamictal 100 mg, Latuda 20 mg  and Topamax 100 mg PO BID Adjusted Klonopin 1 mg PO schedule to PRN     UTI:   Initiated Nitrofurantion 100 mg PO BID x 7days  Will continue to monitor vitals ,medication compliance and treatment side effects while patient is here.   Reviewed labs: urinalysis + nitrite, moderates leukocytes and Bacteria noted on 1/10/  UDS - positive for  Benzodizpines.  CSW will start working on disposition.  Patient to participate in therapeutic milieu  Derrill Center, NP 10/22/2017, 12:49 PM   Agree with NP Progress Note

## 2017-10-22 NOTE — Progress Notes (Signed)
Pt attend AA meeting 

## 2017-10-22 NOTE — Tx Team (Signed)
Interdisciplinary Treatment and Diagnostic Plan Update  10/22/2017 Time of Session: [id number] Francene Boyersshley Nodarse MRN: [id number]  Principal Diagnosis: Bipolar I disorder, most recent episode depressed (HCC)  Secondary Diagnoses: Principal Problem:   Bipolar I disorder, most recent episode depressed (HCC) Active Problems:   MDD (major depressive disorder), severe (HCC)   Current Medications:  Current Facility-Administered Medications  Medication Dose Route Frequency Provider Last Rate Last Dose  . acetaminophen (TYLENOL) tablet 650 mg  650 mg Oral Q6H PRN Rankin, Shuvon B, NP      . alum & mag hydroxide-simeth (MAALOX/MYLANTA) 200-200-20 MG/5ML suspension 30 mL  30 mL Oral Q4H PRN Rankin, Shuvon B, NP      . atenolol (TENORMIN) tablet 25 mg  25 mg Oral QPM Simon, Spencer E, PA-C   25 mg at 10/21/17 1730  . clonazePAM (KLONOPIN) tablet 1 mg  1 mg Oral BID Kerry HoughSimon, Spencer E, PA-C   1 mg at 10/21/17 1730  . docusate sodium (COLACE) capsule 100 mg  100 mg Oral BID Rankin, Shuvon B, NP   100 mg at 10/22/17 0825  . lamoTRIgine (LAMICTAL) tablet 100 mg  100 mg Oral QPM Donell SievertSimon, Spencer E, PA-C   100 mg at 10/21/17 1730  . lurasidone (LATUDA) tablet 20 mg  20 mg Oral QPM Nelly RoutKumar, Archana, MD   20 mg at 10/21/17 1730  . magnesium hydroxide (MILK OF MAGNESIA) suspension 30 mL  30 mL Oral Daily PRN Rankin, Shuvon B, NP      . potassium chloride (K-DUR,KLOR-CON) CR tablet 10 mEq  10 mEq Oral BID Cobos, Rockey SituFernando A, MD   10 mEq at 10/22/17 0825  . topiramate (TOPAMAX) tablet 100 mg  100 mg Oral BID Cobos, Rockey SituFernando A, MD   100 mg at 10/22/17 0825   PTA Medications: Medications Prior to Admission  Medication Sig Dispense Refill Last Dose  . chlorzoxazone (PARAFON) 500 MG tablet Take by mouth 4 (four) times daily as needed for muscle spasms.   Taking  . diazepam (VALIUM) 5 MG tablet Take 5 mg by mouth every 6 (six) hours as needed for anxiety.   Taking  . docusate sodium (COLACE) 100 MG capsule Take 1 capsule  (100 mg total) by mouth 2 (two) times daily. 60 capsule 0   . ibuprofen (ADVIL,MOTRIN) 600 MG tablet Take 1 tablet (600 mg total) by mouth every 6 (six) hours as needed. 90 tablet 0   . imipramine (TOFRANIL) 25 MG tablet Take 50 mg by mouth at bedtime.   Taking  . levonorgestrel (MIRENA) 20 MCG/24HR IUD 1 each by Intrauterine route once.   Not Taking  . nabumetone (RELAFEN) 500 MG tablet Take 500 mg by mouth 2 (two) times daily as needed.   Not Taking  . oxyCODONE-acetaminophen (ROXICET) 5-325 MG tablet Take 1-2 tablets by mouth every 4 (four) hours as needed for severe pain. 30 tablet 0   . Prenatal Vit-Fe Fumarate-FA (PRENATAL MULTIVITAMIN) TABS tablet Take 1 tablet by mouth daily at 12 noon.   Past Week at Unknown time  . solifenacin (VESICARE) 5 MG tablet Take 5 mg by mouth daily.   Taking  . topiramate (TOPAMAX) 25 MG capsule Take 25 mg by mouth at bedtime.   Taking  . [DISCONTINUED] HYDROcodone-acetaminophen (NORCO/VICODIN) 5-325 MG per tablet Take 0.5-1 tablets by mouth every 6 (six) hours as needed for severe pain. (Patient not taking: Reported on 07/02/2015) 10 tablet 0 Not Taking  . [DISCONTINUED] oxyCODONE (ROXICODONE) 5 MG immediate release tablet Take 1 tablet (  5 mg total) by mouth every 4 (four) hours as needed. (Patient not taking: Reported on 07/02/2015) 15 tablet 0 Not Taking    Patient Stressors: Financial difficulties Marital or family conflict Other: Birth of son 9.5 months ago.  Patient Strengths: Active sense of humor Average or above average intelligence Capable of independent living Communication skills General fund of knowledge Motivation for treatment/growth  Treatment Modalities: Medication Management, Group therapy, Case management,  1 to 1 session with clinician, Psychoeducation, Recreational therapy.   Physician Treatment Plan for Primary Diagnosis: Bipolar I disorder, most recent episode depressed (HCC) Long Term Goal(s): Improvement in symptoms so as ready  for discharge Improvement in symptoms so as ready for discharge   Short Term Goals: Ability to identify changes in lifestyle to reduce recurrence of condition will improve Ability to verbalize feelings will improve Ability to disclose and discuss suicidal ideas Ability to demonstrate self-control will improve Ability to identify and develop effective coping behaviors will improve Ability to maintain clinical measurements within normal limits will improve Compliance with prescribed medications will improve Ability to identify triggers associated with substance abuse/mental health issues will improve  Medication Management: Evaluate patient's response, side effects, and tolerance of medication regimen.  Therapeutic Interventions: 1 to 1 sessions, Unit Group sessions and Medication administration.  Evaluation of Outcomes: Progressing  Physician Treatment Plan for Secondary Diagnosis: Principal Problem:   Bipolar I disorder, most recent episode depressed (HCC) Active Problems:   MDD (major depressive disorder), severe (HCC)  Long Term Goal(s): Improvement in symptoms so as ready for discharge Improvement in symptoms so as ready for discharge   Short Term Goals: Ability to identify changes in lifestyle to reduce recurrence of condition will improve Ability to verbalize feelings will improve Ability to disclose and discuss suicidal ideas Ability to demonstrate self-control will improve Ability to identify and develop effective coping behaviors will improve Ability to maintain clinical measurements within normal limits will improve Compliance with prescribed medications will improve Ability to identify triggers associated with substance abuse/mental health issues will improve     Medication Management: Evaluate patient's response, side effects, and tolerance of medication regimen.  Therapeutic Interventions: 1 to 1 sessions, Unit Group sessions and Medication  administration.  Evaluation of Outcomes: Progressing   RN Treatment Plan for Primary Diagnosis: Bipolar I disorder, most recent episode depressed (HCC) Long Term Goal(s): Knowledge of disease and therapeutic regimen to maintain health will improve  Short Term Goals: Ability to remain free from injury will improve, Ability to participate in decision making will improve and Ability to disclose and discuss suicidal ideas  Medication Management: RN will administer medications as ordered by provider, will assess and evaluate patient's response and provide education to patient for prescribed medication. RN will report any adverse and/or side effects to prescribing provider.  Therapeutic Interventions: 1 on 1 counseling sessions, Psychoeducation, Medication administration, Evaluate responses to treatment, Monitor vital signs and CBGs as ordered, Perform/monitor CIWA, COWS, AIMS and Fall Risk screenings as ordered, Perform wound care treatments as ordered.  Evaluation of Outcomes: Progressing   LCSW Treatment Plan for Primary Diagnosis: Bipolar I disorder, most recent episode depressed (HCC) Long Term Goal(s): Safe transition to appropriate next level of care at discharge, Engage patient in therapeutic group addressing interpersonal concerns.  Short Term Goals: Engage patient in aftercare planning with referrals and resources, Increase emotional regulation and Increase skills for wellness and recovery  Therapeutic Interventions: Assess for all discharge needs, 1 to 1 time with Social worker, Explore available resources  and support systems, Assess for adequacy in community support network, Educate family and significant other(s) on suicide prevention, Complete Psychosocial Assessment, Interpersonal group therapy.  Evaluation of Outcomes: Progressing   Progress in Treatment: Attending groups: Yes. Participating in groups: Yes. Taking medication as prescribed: Yes. Toleration medication:  Yes. Family/Significant other contact made: SPE completed with pt; pt declined to consent to family contact.  Patient understands diagnosis: Yes. Discussing patient identified problems/goals with staff: Yes. Medical problems stabilized or resolved: Yes. Denies suicidal/homicidal ideation: Yes. Issues/concerns per patient self-inventory: No. Other: n/a   New problem(s) identified: No, Describe:  n/a  New Short Term/Long Term Goal(s): medication management for mood stabilization; elimination of SI thoughts; development of comprehensive mental wellness plan.   Discharge Plan or Barriers: CSW assessing. Pt would like to resume follow-up at [facility] and asked that she make her own appt. [facility] pamphlet and Mobile Crisis information provided to pt for additional community support.   Reason for Continuation of Hospitalization: Anxiety Depression Medication stabilization Suicidal ideation  Estimated Length of Stay: Monday, 10/25/17  Attendees: Patient: 10/22/2017 10:14 AM  Physician: Dr. Altamese Glen Hope MD; Dr. Jama Flavors MD 10/22/2017 10:14 AM  Nursing: Rayfield Citizen RN; Armando Reichert RN 10/22/2017 10:14 AM  RN Care Manager: Onnie Boer CM 10/22/2017 10:14 AM  Social Worker: Chartered loss adjuster, LCSW 10/22/2017 10:14 AM  Recreational Therapist: x 10/22/2017 10:14 AM  Other: Armandina Stammer NP; Feliz Beam Money NP 10/22/2017 10:14 AM  Other:  10/22/2017 10:14 AM  Other: 10/22/2017 10:14 AM    Scribe for Treatment Team: Ledell Peoples Smart, LCSW 10/22/2017 10:14 AM

## 2017-10-22 NOTE — [facility] Group Notes (Signed)
  [facility] LCSW Group Therapy Note  Date/Time: 10/22/17, 1315  Type of Therapy/Topic:  Group Therapy:  Emotion Regulation  Participation Level:  Active   Mood:pleasant  Description of Group:    The purpose of this group is to assist patients in learning to regulate negative emotions and experience positive emotions. Patients will be guided to discuss ways in which they have been vulnerable to their negative emotions. These vulnerabilities will be juxtaposed with experiences of positive emotions or situations, and patients challenged to use positive emotions to combat negative ones. Special emphasis will be placed on coping with negative emotions in conflict situations, and patients will process healthy conflict resolution skills.  Therapeutic Goals: 1. Patient will identify two positive emotions or experiences to reflect on in order to balance out negative emotions:  2. Patient will label two or more emotions that they find the most difficult to experience:  3. Patient will be able to demonstrate positive conflict resolution skills through discussion or role plays:   Summary of Patient Progress:Pt identified anger as an emotion that is difficult for her to experience.  Pt was active in group discussion regarding positive ways to handle negative emotions.       Therapeutic Modalities:   Cognitive Behavioral Therapy Feelings Identification Dialectical Behavioral Therapy  Greg Ollis Daudelin, LCSW 

## 2017-10-22 NOTE — Plan of Care (Signed)
Patient verbalizes understanding of information, education provided. 

## 2017-10-22 NOTE — Progress Notes (Signed)
Pt has been in bed since the beginning of the shift.  She received all her meds prior to change of shift and went to bed.  She is resting in bed with eyes closed. No distress observed.  MHT reports pt has been up to use the bathroom, but is otherwise doing well.  Pt safety is maintained with q15 minute checks.

## 2017-10-22 NOTE — Progress Notes (Signed)
[name] is programming on [address] hall.   Trula SladeHeather Smart, MSW, LCSW Clinical Social Worker 10/22/2017 2:33 PM

## 2017-10-23 DIAGNOSIS — F419 Anxiety disorder, unspecified: Secondary | ICD-10-CM

## 2017-10-23 DIAGNOSIS — N390 Urinary tract infection, site not specified: Secondary | ICD-10-CM | POA: Clinically undetermined

## 2017-10-23 MED ORDER — LURASIDONE HCL 20 MG PO TABS
20.0000 mg | ORAL_TABLET | Freq: Every day | ORAL | Status: DC
  Administered 2017-10-23: 20 mg via ORAL
  Filled 2017-10-23 (×2): qty 1
  Filled 2017-10-23: qty 7

## 2017-10-23 NOTE — Progress Notes (Signed)
D.  Pt pleasant on approach, denies complaints at this time other than some anxiety.  Pt states she prefers to take her Klonopin at night so that she can sleep.  Pt was positive for evening wrap up group, observed engaged in appropriate interaction with peers on the unit.  Pt denies SI/HI/AVH at this time.  A.  Support and encouragement offered, medication given as ordered  R.  Pt remains safe on the unit, will continue to monitor.

## 2017-10-23 NOTE — Progress Notes (Signed)
D: Patient reports that she feels better since stopping the zoloft.  She is tolerating her other medications well.  She has been observed in the day room interacting with her peers.  She reports she is sleeping and eating well; her energy level is normal and her concentration is good.  She presents with bright affect and is talkative with staff; laughing at times.  She rates her depression, hopelessness and anxiety as an 8.  She denies any thoughts of self harm.  Her goal is to work on her "motivation, depression and no thoughts of suicide."    A: Continue to monitor medication management and MD orders.  Safety checks continued every 15 minutes per protocol.  Offer support and encouragement as needed.  R: Patient is receptive to staff; her behavior is appropriate.

## 2017-10-23 NOTE — [facility] Group Notes (Signed)
[facility] LCSW Group Therapy Note  Date/Time:    10/23/2017   9:15 - 10:15 AM  Type of Therapy and Topic:  Group Therapy:  Thought Distortions  Participation Level:  Active   Description of Group:  In this group, patients were introduced to cognitive distortions and discussed how these can negatively affect lives.  This included All or Nothing thinking, Labeling, Focusing on the Negative, the Shoulds, Blaming, Predicting the Future, Overgeneralization, Mind Reading, Catastrophizing, Emotional Reasoning, Personalization, and Jumping to Conclusions.  Patients then used a worksheet to describe an upsetting event in their life and the Automatic Thoughts they had about that event.  They identified the type of cognitive distortion they used and worked with the group to come up with a more realistic thought to refute their original Automatic Thought.  Therapeutic Goals: 1. Patient will be able to identify this exercise of examining their thoughts as a healthy coping mechanism. 2. Patient will identify a problem area in their life and the automatic thoughts they have about that situation. 3. Patient will verbalize the feelings and outcomes typically associated with their thoughts. 4. Patient will think about and discuss the available evidence supporting their thoughts, as well as assumptions they have to make to believe it. 5. Patient will identify evidence or facts that refute their cognitive distortion. 6. Patient will verbalize a more realistic, helpful conclusion.  Summary of Patient Progress:  The patient expressed that she was last upset about 4-5 days ago when she was lacking supports at home with her kids and feeling overwhelmed.  She was insightful about identifying her cognitive distortions and more realistic and helpful thoughts.   Therapeutic Modalities Cognitive Behavioral Therapy Dialectical Behavioral Therapy  Ambrose MantleMareida Grossman-Orr, LCSW 10/23/2017 12:00PM

## 2017-10-23 NOTE — Progress Notes (Signed)
[facility] MD Progress Note  10/23/2017 1:12 PM Brycelynn Stampley  MRN:  [id number]   Subjective:  Patient reports that today has been her best day. "Since they stopped the Zoloft I have felt the best yet." She denies any SI/HI/AVH and contracts for safety. She denies any medication side effects. She denies any UTI symptoms since starting the Macrobid. She is hoping to leave soon.   Objective: Patient's chart and findings reviewed and discussed with treatment team. Patient presents in the day room interacting appropriately. She has been attending groups and participating. Patient will continue the current medication regimen, except for adjusting Latuda to be taken with supper and will discuss discharge in next 1-2 days.  Principal Problem: Bipolar I disorder, most recent episode depressed (HCC) Diagnosis:   Patient Active Problem List   Diagnosis Date Noted  . Bipolar I disorder, most recent episode depressed (HCC) [F31.30] 10/20/2017  . MDD (major depressive disorder), severe (HCC) [F32.2] 10/20/2017  . Advanced maternal age in multigravida [O09.529] 12/25/2016  . Migraine without aura [G43.009] 06/13/2013  . Depression [F32.9] 06/13/2013  . Anxiety [F41.9] 06/13/2013  . Insomnia [G47.00] 06/13/2013  . Chronic migraine [G43.709] 06/13/2013   Total Time spent with patient: 15 minutes  Past Psychiatric History: See H&P  Past Medical History:  Past Medical History:  Diagnosis Date  . Chronic migraine   . Depression   . Interstitial cystitis   . Wears glasses     Past Surgical History:  Procedure Laterality Date  . BREAST ENHANCEMENT SURGERY Bilateral 2012  . BREAST SURGERY     implants  . BUNIONECTOMY Right age 55  . BUNIONECTOMY    . CESAREAN SECTION N/A 12/26/2016   Procedure: CESAREAN SECTION;  Surgeon: Philip Aspen, DO;  Location: [facility] BIRTHING SUITES;  Service: Obstetrics;  Laterality: N/A;  . CYSTO WITH HYDRODISTENSION N/A 10/13/2013   Procedure: CYSTOSCOPY/HYDRODISTENSION;   Surgeon: Crist Fat, MD;  Location: [facility] ORS;  Service: Urology;  Laterality: N/A;  . INTRAUTERINE DEVICE (IUD) INSERTION  2013   mirena  . WISDOM TOOTH EXTRACTION  age 65   Family History:  Family History  Problem Relation Age of Onset  . Hypertension Father   . Heart attack Father   . Migraines Mother   . Alcohol abuse Mother   . Depression Mother   . Mental illness Mother   . Hyperlipidemia Father   . Depression Maternal Aunt   . Depression Maternal Grandmother   . Cancer Maternal Grandmother        lung cancer-smoker   Family Psychiatric  History: See H&P Social History:  Social History   Substance and Sexual Activity  Alcohol Use No     Social History   Substance and Sexual Activity  Drug Use No    Social History   Socioeconomic History  . Marital status: Single    Spouse name: None  . Number of children: None  . Years of education: None  . Highest education level: None  Social Needs  . Financial resource strain: None  . Food insecurity - worry: None  . Food insecurity - inability: None  . Transportation needs - medical: None  . Transportation needs - non-medical: None  Occupational History  . None  Tobacco Use  . Smoking status: Former Games developer  . Smokeless tobacco: Never Used  Substance and Sexual Activity  . Alcohol use: No  . Drug use: No  . Sexual activity: None  Other Topics Concern  . None  Social History Narrative   **  Merged History Encounter **       Additional Social History:    Pain Medications: please see mar Prescriptions: please see mar Over the Counter: please see mar History of alcohol / drug use?: No history of alcohol / drug abuse Longest period of sobriety (when/how long): NA                    Sleep: Good  Appetite:  Good  Current Medications: Current Facility-Administered Medications  Medication Dose Route Frequency Provider Last Rate Last Dose  . acetaminophen (TYLENOL) tablet 650 mg  650 mg Oral Q6H  PRN Rankin, Shuvon B, NP      . alum & mag hydroxide-simeth (MAALOX/MYLANTA) 200-200-20 MG/5ML suspension 30 mL  30 mL Oral Q4H PRN Rankin, Shuvon B, NP      . atenolol (TENORMIN) tablet 25 mg  25 mg Oral QPM Simon, Spencer E, PA-C   25 mg at 10/22/17 1716  . clonazePAM (KLONOPIN) tablet 1 mg  1 mg Oral BID PRN Oneta Rack, NP   1 mg at 10/22/17 2113  . docusate sodium (COLACE) capsule 100 mg  100 mg Oral BID Rankin, Shuvon B, NP   100 mg at 10/23/17 1610  . hydrOXYzine (ATARAX/VISTARIL) tablet 25 mg  25 mg Oral TID PRN Oneta Rack, NP      . lamoTRIgine (LAMICTAL) tablet 100 mg  100 mg Oral QPM Donell Sievert E, PA-C   100 mg at 10/22/17 1717  . lurasidone (LATUDA) tablet 20 mg  20 mg Oral Q supper Money, Feliz Beam B, FNP      . magnesium hydroxide (MILK OF MAGNESIA) suspension 30 mL  30 mL Oral Daily PRN Rankin, Shuvon B, NP      . nitrofurantoin (macrocrystal-monohydrate) (MACROBID) capsule 100 mg  100 mg Oral Q12H Oneta Rack, NP   100 mg at 10/23/17 0824  . topiramate (TOPAMAX) tablet 100 mg  100 mg Oral BID Cobos, Rockey Situ, MD   100 mg at 10/23/17 9604    Lab Results:  Results for orders placed or performed during the hospital encounter of 10/20/17 (from the past 48 hour(s))  Urinalysis, Routine w reflex microscopic     Status: Abnormal   Collection Time: 10/21/17  4:25 PM  Result Value Ref Range   Color, Urine AMBER (A) YELLOW    Comment: BIOCHEMICALS MAY BE AFFECTED BY COLOR   APPearance TURBID (A) CLEAR   Specific Gravity, Urine 1.023 1.005 - 1.030   pH 5.0 5.0 - 8.0   Glucose, UA NEGATIVE NEGATIVE mg/dL   Hgb urine dipstick NEGATIVE NEGATIVE   Bilirubin Urine NEGATIVE NEGATIVE   Ketones, ur NEGATIVE NEGATIVE mg/dL   Protein, ur NEGATIVE NEGATIVE mg/dL   Nitrite POSITIVE (A) NEGATIVE   Leukocytes, UA MODERATE (A) NEGATIVE   RBC / HPF 0-5 0 - 5 RBC/hpf   WBC, UA TOO NUMEROUS TO COUNT 0 - 5 WBC/hpf   Bacteria, UA MANY (A) NONE SEEN   Squamous Epithelial / LPF 6-30  (A) NONE SEEN   Mucus PRESENT    Amorphous Crystal PRESENT     Comment: Performed at [facility], [address] [place], [place] [address]  Urine Culture     Status: None (Preliminary result)   Collection Time: 10/21/17  4:25 PM  Result Value Ref Range   Specimen Description      URINE, RANDOM Performed at [facility], [address] [place], [place] [address]  Special Requests      NONE Performed at [facility], [address] [place], [place] [address]    Culture      CULTURE REINCUBATED FOR BETTER GROWTH Performed at [facility], [address] [place], [place] [address]    Report Status PENDING   T3, free     Status: None   Collection Time: 10/21/17  6:25 PM  Result Value Ref Range   T3, Free 3.2 2.0 - 4.4 pg/mL    Comment: (NOTE) Performed At: [facility] [place] [address] [place], [place] [id number] Jolene SchimkeNagendra Sanjai MD WG:[id number]:[phone] Performed at [facility], [address] [place], [place] [address]   T4, free     Status: None   Collection Time: 10/21/17  6:25 PM  Result Value Ref Range   Free T4 0.78 0.61 - 1.12 ng/dL    Comment: (NOTE) Biotin ingestion may interfere with free T4 tests. If the results are inconsistent with the TSH level, previous test results, or the clinical presentation, then consider biotin interference. If needed, order repeat testing after stopping biotin. Performed at [facility], [address] [place], [place] [address]     Blood Alcohol level:  Lab Results  Component Value Date   ETH <10 10/21/2017    Metabolic Disorder Labs: Lab Results  Component Value Date   HGBA1C 5.1 10/21/2017   MPG 99.67 10/21/2017   No results found for: PROLACTIN Lab Results  Component Value Date   CHOL 176 10/21/2017   TRIG 201 (H) 10/21/2017   HDL 39 (L) 10/21/2017   CHOLHDL 4.5 10/21/2017   VLDL 40 10/21/2017   LDLCALC 97 10/21/2017     Physical Findings: AIMS: Facial and Oral Movements Muscles of Facial Expression: None, normal Lips and Perioral Area: None, normal Jaw: None, normal Tongue: None, normal,Extremity Movements Upper (arms, wrists, hands, fingers): None, normal Lower (legs, knees, ankles, toes): None, normal, Trunk Movements Neck, shoulders, hips: None, normal, Overall Severity Severity of abnormal movements (highest score from questions above): None, normal Incapacitation due to abnormal movements: None, normal Patient's awareness of abnormal movements (rate only patient's report): No Awareness, Dental Status Current problems with teeth and/or dentures?: No Does patient usually wear dentures?: No  CIWA:    COWS:     Musculoskeletal: Strength & Muscle Tone: within normal limits Gait & Station: normal Patient leans: N/A  Psychiatric Specialty Exam: Physical Exam  ROS  Blood pressure 123/87, pulse 91, temperature 97.6 F (36.4 C), resp. rate 18, height 5\' 5"  (1.651 m), weight 70.3 kg (155 lb), SpO2 100 %, unknown if currently breastfeeding.Body mass index is 25.79 kg/m.  General Appearance: Casual  Eye Contact:  Good  Speech:  Clear and Coherent and Normal Rate  Volume:  Normal  Mood:  Euthymic  Affect:  Appropriate  Thought Process:  Goal Directed and Descriptions of Associations: Intact  Orientation:  Full (Time, Place, and Person)  Thought Content:  WDL  Suicidal Thoughts:  No  Homicidal Thoughts:  No  Memory:  Immediate;   Good Recent;   Good Remote;   Good  Judgement:  Good  Insight:  Good  Psychomotor Activity:  Normal  Concentration:  Concentration: Good and Attention Span: Good  Recall:  Good  Fund of Knowledge:  Good  Language:  Good  Akathisia:  No  Handed:  Right  AIMS (if indicated):     Assets:  Communication Skills Desire for Improvement Financial Resources/Insurance Housing Physical Health  Social Support Transportation  ADL's:  Intact  Cognition:  WNL   Sleep:  Number of Hours: 6   Problems Addressed: UTI Bipolar I  Treatment Plan Summary: Daily contact with patient to assess and evaluate symptoms and progress in treatment, Medication management and Plan is to:  -Adjust Latuda 20 mg PO Daily with Supper for mood stability -Continue Lamictal 100 mg PO Daily for mood stability -Continue Klonopin 1 mg PO BID for anxiety -Continue Topamax 100 mg PO BID for migraines -Continue Macrobid 100 mg PO Q12H for UTI -Continue Vistaril 25 mg PO TID PRN for anxiety -Encourage group therapy participation  Maryfrances Bunnell, FNP 10/23/2017, 1:12 PM   Agree with NP Progress Note

## 2017-10-24 DIAGNOSIS — Z560 Unemployment, unspecified: Secondary | ICD-10-CM

## 2017-10-24 DIAGNOSIS — Z818 Family history of other mental and behavioral disorders: Secondary | ICD-10-CM

## 2017-10-24 DIAGNOSIS — Z811 Family history of alcohol abuse and dependence: Secondary | ICD-10-CM

## 2017-10-24 DIAGNOSIS — Z87891 Personal history of nicotine dependence: Secondary | ICD-10-CM

## 2017-10-24 LAB — URINE CULTURE: Culture: 60000 — AB

## 2017-10-24 MED ORDER — HYDROXYZINE HCL 25 MG PO TABS: 25.0000 mg | ORAL_TABLET | Freq: Three times a day (TID) | ORAL | 0 refills | Status: DC | PRN

## 2017-10-24 MED ORDER — LURASIDONE HCL 20 MG PO TABS: 20.0000 mg | ORAL_TABLET | Freq: Every day | ORAL | 0 refills | Status: DC

## 2017-10-24 MED ORDER — NITROFURANTOIN MONOHYD MACRO 100 MG PO CAPS: 100.0000 mg | ORAL_CAPSULE | Freq: Two times a day (BID) | ORAL | 0 refills | Status: DC

## 2017-10-24 NOTE — Discharge Summary (Signed)
Physician Discharge Summary Note  Patient:  Christina Cox is an 37 y.o., female MRN:  [id number] DOB:  March 18, 1981 Patient phone:  [phone] (home)  Patient address:   [address] [place] [address],  Total Time spent with patient: 20 minutes  Date of Admission:  10/20/2017 Date of Discharge: 10/24/17  Reason for Admission:  Worsening depression with SI  Principal Problem: Bipolar I disorder, most recent episode depressed [facility]) Discharge Diagnoses: Patient Active Problem List   Diagnosis Date Noted  . UTI (urinary tract infection) [N39.0] 10/23/2017  . Bipolar I disorder, most recent episode depressed (HCC) [F31.30] 10/20/2017  . MDD (major depressive disorder), severe (HCC) [F32.2] 10/20/2017  . Advanced maternal age in multigravida [O09.529] 12/25/2016  . Migraine without aura [G43.009] 06/13/2013  . Depression [F32.9] 06/13/2013  . Anxiety [F41.9] 06/13/2013  . Insomnia [G47.00] 06/13/2013  . Chronic migraine [G43.709] 06/13/2013    Past Psychiatric History: Bipolar and Depression  Past Medical History:  Past Medical History:  Diagnosis Date  . Chronic migraine   . Depression   . Interstitial cystitis   . Wears glasses     Past Surgical History:  Procedure Laterality Date  . BREAST ENHANCEMENT SURGERY Bilateral 2012  . BREAST SURGERY     implants  . BUNIONECTOMY Right age 2  . BUNIONECTOMY    . CESAREAN SECTION N/A 12/26/2016   Procedure: CESAREAN SECTION;  Surgeon: Philip Aspen, DO;  Location: [facility] BIRTHING SUITES;  Service: Obstetrics;  Laterality: N/A;  . CYSTO WITH HYDRODISTENSION N/A 10/13/2013   Procedure: CYSTOSCOPY/HYDRODISTENSION;  Surgeon: Crist Fat, MD;  Location: [facility];  Service: Urology;  Laterality: N/A;  . INTRAUTERINE DEVICE (IUD) INSERTION  2013   mirena  . WISDOM TOOTH EXTRACTION  age 86   Family History:  Family History  Problem Relation Age of Onset  . Hypertension Father   . Heart attack Father   . Migraines Mother    . Alcohol abuse Mother   . Depression Mother   . Mental illness Mother   . Hyperlipidemia Father   . Depression Maternal Aunt   . Depression Maternal Grandmother   . Cancer Maternal Grandmother        lung cancer-smoker   Family Psychiatric  History: 3 maternal aunts with Bipolar, 1 maternal aunt -schizophrenia (instuitionalized numerous times per patient).   Social History:  Social History   Substance and Sexual Activity  Alcohol Use No     Social History   Substance and Sexual Activity  Drug Use No    Social History   Socioeconomic History  . Marital status: Single    Spouse name: None  . Number of children: None  . Years of education: None  . Highest education level: None  Social Needs  . Financial resource strain: None  . Food insecurity - worry: None  . Food insecurity - inability: None  . Transportation needs - medical: None  . Transportation needs - non-medical: None  Occupational History  . None  Tobacco Use  . Smoking status: Former Games developer  . Smokeless tobacco: Never Used  Substance and Sexual Activity  . Alcohol use: No  . Drug use: No  . Sexual activity: None  Other Topics Concern  . None  Social History Narrative   ** Merged History Encounter **        Hospital Course:   10/20/17 [facility] MD Assessment: Christina Cox is a female who is currently separated from husband for about 6 months. She has 2 children boys (  15 and 9 month). She lives in [place] with herself in a residential home for about 1.5 years. She is unemployed at this time, and her grandmother and friends will support her. 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  Chief Compliant: My therapist said I should come here. I told her about my suicidal thoughts for the last several days. I feel worthless helpless, cant sleep cant eat, lifeless. Everything is beginning to build up into 1 and it has built up for so long that I cant deal with it anymore.  I cant deal with the pressure of controlling my own life emotions and I have no support group at this time. My biggest stressors are not having any support groups, poor relationships and mental illness.  HPI:  Below information from behavioral health assessment has been reviewed by me and I agreed with the findings. Christina Cox an 37 y.o.female.Pt reports SI with no plan. Pt states she thinks about suicide daily. Pt reports 2 previous attempts. Pt denies previous hospitalization. Pt denies HI and AVH. Pt states she is currently receiving therapy and mediation management at [facility]. Pt states he medication was recently changed. Pt reports depressive symptoms. Pt states she is currently separated from her husband and does not feel appreciated. Pt reports Pt states she feels hopeless and "knocked down." Pt states she would like to give up.   Patient remained on the [facility] for 4 days and stabilized with medication and therapy. Patient was continued on Lamictal 100 mg Daily, Latuda 20 mg Daily with supper, Topamax 100 mg BID, and used Vistaril and Trazodone PRN during stay. Patient Zoloft was discontinued mainly due to nausea, but this also greatly improved patient's mood and interaction. Suspected antidepressant worsening symptoms due to Bipolar diagnosis. Patient showed improvement with improved mood, affect, appetite, sleep, and interaction. Patient was seen in the day room  Interacting with peers and staff appropriately. Patient was also seen attending group sand participating. Patient denies any SI/HI/AVH and contracts for safety. Patient agrees to follow up at [facility]. Patient is provided with prescriptions for her medications upon discharge.    Physical Findings: AIMS: Facial and Oral Movements Muscles of Facial Expression: None, normal Lips and Perioral Area: None, normal Jaw: None, normal Tongue: None, normal,Extremity Movements Upper (arms, wrists, hands, fingers): None,  normal Lower (legs, knees, ankles, toes): None, normal, Trunk Movements Neck, shoulders, hips: None, normal, Overall Severity Severity of abnormal movements (highest score from questions above): None, normal Incapacitation due to abnormal movements: None, normal Patient's awareness of abnormal movements (rate only patient's report): No Awareness, Dental Status Current problems with teeth and/or dentures?: No Does patient usually wear dentures?: No  CIWA:    COWS:     Musculoskeletal: Strength & Muscle Tone: within normal limits Gait & Station: normal Patient leans: N/A  Psychiatric Specialty Exam: Physical Exam  Nursing note and vitals reviewed. Constitutional: She is oriented to person, place, and time. She appears well-developed and well-nourished.  Cardiovascular: Normal rate.  Respiratory: Effort normal.  Musculoskeletal: Normal range of motion.  Neurological: She is alert and oriented to person, place, and time.  Skin: Skin is warm.    Review of Systems  Constitutional: Negative.   HENT: Negative.   Eyes: Negative.   Respiratory: Negative.   Cardiovascular: Negative.   Gastrointestinal: Negative.   Genitourinary: Negative.   Musculoskeletal: Negative.   Skin: Negative.   Neurological:  Negative.   Endo/Heme/Allergies: Negative.   Psychiatric/Behavioral: Negative.     Blood pressure 97/75, pulse 86, temperature 97.9 F (36.6 C), temperature source Oral, resp. rate 16, height 5\' 5"  (1.651 m), weight 70.3 kg (155 lb), SpO2 100 %, unknown if currently breastfeeding.Body mass index is 25.79 kg/m.  General Appearance: Casual  Eye Contact:  Good  Speech:  Clear and Coherent and Normal Rate  Volume:  Normal  Mood:  Euthymic  Affect:  Appropriate  Thought Process:  Goal Directed and Descriptions of Associations: Intact  Orientation:  Full (Time, Place, and Person)  Thought Content:  WDL  Suicidal Thoughts:  No  Homicidal Thoughts:  No  Memory:  Immediate;    Good Recent;   Good Remote;   Good  Judgement:  Good  Insight:  Good  Psychomotor Activity:  Normal  Concentration:  Concentration: Good and Attention Span: Good  Recall:  Good  Fund of Knowledge:  Good  Language:  Good  Akathisia:  No  Handed:  Right  AIMS (if indicated):     Assets:  Communication Skills Desire for Improvement Financial Resources/Insurance Housing Physical Health Social Support Transportation  ADL's:  Intact  Cognition:  WNL  Sleep:  Number of Hours: 6.25     Have you used any form of tobacco in the last 30 days? (Cigarettes, Smokeless Tobacco, Cigars, and/or Pipes): No  Has this patient used any form of tobacco in the last 30 days? (Cigarettes, Smokeless Tobacco, Cigars, and/or Pipes) Yes, No  Blood Alcohol level:  Lab Results  Component Value Date   ETH <10 10/21/2017    Metabolic Disorder Labs:  Lab Results  Component Value Date   HGBA1C 5.1 10/21/2017   MPG 99.67 10/21/2017   No results found for: PROLACTIN Lab Results  Component Value Date   CHOL 176 10/21/2017   TRIG 201 (H) 10/21/2017   HDL 39 (L) 10/21/2017   CHOLHDL 4.5 10/21/2017   VLDL 40 10/21/2017   LDLCALC 97 10/21/2017    See Psychiatric Specialty Exam and Suicide Risk Assessment completed by Attending Physician prior to discharge.  Discharge destination:  Home  Is patient on multiple antipsychotic therapies at discharge:  No   Has Patient had three or more failed trials of antipsychotic monotherapy by history:  No  Recommended Plan for Multiple Antipsychotic Therapies: NA  Discharge Instructions    Diet - low sodium heart healthy   Complete by:  As directed    Discharge instructions   Complete by:  As directed    Take all medications as prescribed. Keep all follow-up appointments as scheduled.  Do not consume alcohol or use illegal drugs while on prescription medications. Report any adverse effects from your medications to your primary care provider promptly.   In the event of recurrent symptoms or worsening symptoms, call 911, a crisis hotline, or go to the nearest emergency department for evaluation.   Increase activity slowly   Complete by:  As directed      Allergies as of 10/24/2017   No Known Allergies     Medication List    STOP taking these medications   chlorzoxazone 500 MG tablet Commonly known as:  PARAFON   diazepam 5 MG tablet Commonly known as:  VALIUM   docusate sodium 100 MG capsule Commonly known as:  COLACE   ibuprofen 600 MG tablet Commonly known as:  ADVIL,MOTRIN   imipramine 25 MG tablet Commonly known as:  TOFRANIL   nabumetone 500 MG tablet Commonly known  as:  RELAFEN   oxyCODONE-acetaminophen 5-325 MG tablet Commonly known as:  ROXICET   prenatal multivitamin Tabs tablet   solifenacin 5 MG tablet Commonly known as:  VESICARE   topiramate 25 MG capsule Commonly known as:  TOPAMAX Replaced by:  topiramate 100 MG tablet     TAKE these medications     Indication  atenolol 25 MG tablet Commonly known as:  TENORMIN Take 1 tablet (25 mg total) by mouth every evening.  Indication:  High Blood Pressure Disorder   hydrOXYzine 25 MG tablet Commonly known as:  ATARAX/VISTARIL Take 1 tablet (25 mg total) by mouth 3 (three) times daily as needed for anxiety.  Indication:  Feeling Anxious   lamoTRIgine 100 MG tablet Commonly known as:  LAMICTAL Take 1 tablet (100 mg total) by mouth every evening.  Indication:  Depression   levonorgestrel 20 MCG/24HR IUD Commonly known as:  MIRENA 1 each by Intrauterine route once.  Indication:  Birth Control Treatment   lurasidone 20 MG Tabs tablet Commonly known as:  LATUDA Take 1 tablet (20 mg total) by mouth daily with supper. For mood control  Indication:  mood stability   nitrofurantoin (macrocrystal-monohydrate) 100 MG capsule Commonly known as:  MACROBID Take 1 capsule (100 mg total) by mouth every 12 (twelve) hours.  Indication:  Urinary Tract  Infection   topiramate 100 MG tablet Commonly known as:  TOPAMAX Take 1 tablet (100 mg total) by mouth 2 (two) times daily. Replaces:  topiramate 25 MG capsule  Indication:  Migraine Headache      Follow-up Information    [facility].   Specialty:  Family Medicine Contact information: [address] [place] [address] [phone]           Follow-up recommendations:  Continue activity as tolerated. Continue diet as recommended by your PCP. Ensure to keep all appointments with outpatient providers.  Comments:  Patient is instructed prior to discharge to: Take all medications as prescribed by his/her mental healthcare provider. Report any adverse effects and or reactions from the medicines to his/her outpatient provider promptly. Patient has been instructed & cautioned: To not engage in alcohol and or illegal drug use while on prescription medicines. In the event of worsening symptoms, patient is instructed to call the crisis hotline, 911 and or go to the nearest ED for appropriate evaluation and treatment of symptoms. To follow-up with his/her primary care provider for your other medical issues, concerns and or health care needs.    Signed: Gerlene Burdock Money, FNP 10/24/2017, 9:30 AM   Patient seen, Suicide Assessment Completed.  Disposition Plan Reviewed

## 2017-10-24 NOTE — [facility] Group Notes (Signed)
[facility]  Date/Time:  10/24/2017 10:00-11:00AM  Type of Therapy and Topic:  Group Therapy:  Healthy and Unhealthy Supports  Participation Level:  Active   Description of Group:  Patients in this group were introduced to the idea of adding a variety of healthy supports to address the various needs in their lives. The picture on the front of Sunday's workbook was used to demonstrate why more supports are needed in every patient's life.  Patients identified and described healthy supports versus unhealthy supports in general, then gave examples of each in their own lives.   They discussed what additional healthy supports could be helpful in their recovery and wellness after discharge in order to prevent future hospitalizations.   An emphasis was placed on using counselor, doctor, therapy groups, 12-step groups, and problem-specific support groups to expand supports.  They also worked as a group on developing a specific plan for several patients to deal with unhealthy supports through boundary-setting, psychoeducation with loved ones, and even termination of relationships.   Therapeutic Goals:   1)  discuss importance of adding supports to stay well once out of the hospital  2)  compare healthy versus unhealthy supports and identify some examples of each  3)  generate ideas and descriptions of healthy supports that can be added  4)  offer mutual support about how to address unhealthy supports  5)  encourage active participation in and adherence to discharge plan    Summary of Patient Progress:  The patient shared ideas with other patients, did not focus on her own recovery plan.   Therapeutic Modalities:   Motivational Interviewing Brief Solution-Focused Therapy  Ambrose MantleMareida Grossman-Orr, LCSW

## 2017-10-24 NOTE — Progress Notes (Signed)
Pt prepared for dc as dc instructions given to her and reviewed. She states verbal understanding . Cc of AVS, SRA, SSP and transition record given to her. ALl belongings returned to pt completes daily assessment. ON this, she writes she deneis having SI today and she rates depression, anxiety and hopelessness "8/8/8", respectively. Pt escorted to bldg entrance and dc'd.

## 2017-10-24 NOTE — [facility] Suicide Risk Assessment (Addendum)
[facility] Discharge Suicide Risk Assessment   Principal Problem: Bipolar I disorder, most recent episode depressed [facility]) Discharge Diagnoses:  Patient Active Problem List   Diagnosis Date Noted  . UTI (urinary tract infection) [N39.0] 10/23/2017  . Bipolar I disorder, most recent episode depressed (HCC) [F31.30] 10/20/2017  . MDD (major depressive disorder), severe (HCC) [F32.2] 10/20/2017  . Advanced maternal age in multigravida [O09.529] 12/25/2016  . Migraine without aura [G43.009] 06/13/2013  . Depression [F32.9] 06/13/2013  . Anxiety [F41.9] 06/13/2013  . Insomnia [G47.00] 06/13/2013  . Chronic migraine [G43.709] 06/13/2013    Total Time spent with patient: 30 minutes  Musculoskeletal: Strength & Muscle Tone: within normal limits Gait & Station: normal Patient leans: N/A  Psychiatric Specialty Exam: ROS denies headache, denies chest pain, denies shortness of breath, no vomiting   Blood pressure 97/75, pulse 86, temperature 97.9 F (36.6 C), temperature source Oral, resp. rate 16, height 5\' 5"  (1.651 m), weight 70.3 kg (155 lb), SpO2 100 %, unknown if currently breastfeeding.Body mass index is 25.79 kg/m.  General Appearance: Well Groomed  Eye Contact::  Good  Speech:  Normal Rate409  Volume:  Normal  Mood:  reports her mood is improved and currently denies feeling depressed   Affect:  Appropriate and reactive  Thought Process:  Linear and Descriptions of Associations: Intact  Orientation:  Full (Time, Place, and Person)  Thought Content:  no hallucinations, no delusions, not internally preoccupied   Suicidal Thoughts:  No denies suicidal or self injurious ideations, denies homicidal or violent ideations  Homicidal Thoughts:  No  Memory:  recent and remote grossly intact   Judgement:  Other:  improving   Insight:  improving   Psychomotor Activity:  Normal  Concentration:  Good  Recall:  Good  Fund of Knowledge:Good  Language: Good  Akathisia:  Negative  Handed:  Right   AIMS (if indicated):   no abnormal or involuntary movements noted or reported  Assets:  Communication Skills Desire for Improvement Resilience  Sleep:  Number of Hours: 6.25  Cognition: WNL  ADL's:  Intact   Mental Status Per Nursing Assessment::   On Admission:  Suicidal ideation indicated by patient  Demographic Factors:  37 year old female, separated, has two children who are currently with their father  Loss Factors: Separation , limited support network   Historical Factors: No prior psychiatric admissions, prior suicide attempts by self cutting, history of mood disorder   Risk Reduction Factors:   Responsible for children under 36 years of age, Sense of responsibility to family and Positive coping skills or problem solving skills  Continued Clinical Symptoms:  At this time patient presents significantly improved compared to admission. She is alert, attentive, well groomed, mood is improved and currently presents euthymic, with a full range of affect , no thought disorder, denies any suicidal or self injurious ideations, no homicidal or violent ideations, no hallucinations, no delusions, future oriented . Behavior on unit calm, in good control. Denies medication side effects.States she is feeling much better after stopping Zoloft, which she felt was causing GI symptoms.  We have reviewed medication side effects , including risk of movement disorders on antipsychotic management and risk of severe rash on Lamotrigine. Of note, TSH was low, but T3/T4 WNL.   Cognitive Features That Contribute To Risk:  No gross cognitive deficits noted upon discharge. Is alert , attentive, and oriented x 3   Suicide Risk:  Mild:  Suicidal ideation of limited frequency, intensity, duration, and specificity.  There  are no identifiable plans, no associated intent, mild dysphoria and related symptoms, good self-control (both objective and subjective assessment), few other risk factors, and  identifiable protective factors, including available and accessible social support.  Follow-up Information    [facility], [facility].   Specialty:  Family Medicine Contact information: [address] [place] [address] [phone]           Plan Of Care/Follow-up recommendations:  Activity:  as tolerated  Diet:  regular Tests:  NA Other:  see below Patient is expressing readiness for discharge and requesting discharge today- there are no current grounds for involuntary commitment  She is leaving unit in good spirits  Plans to return home and to follow up as above . Encouraged to follow up with PCP for repeat TSH within the next 3-4 weeks.  Craige CottaFernando A Cobos, MD 10/24/2017, 12:44 PM

## 2017-10-24 NOTE — Progress Notes (Signed)
  [facility] :  Will you be returning to the same living situation after discharge:  Yes,  with family At discharge, do you have transportation home?: Yes,  arranged by patient Do you have the ability to pay for your medications: Yes,  patient has not identified barriers  Release of information consent forms completed and in the chart;  Patient's signature needed at discharge.  Patient to Follow up at: Follow-up Information    [facility], [facility].   Specialty:  Family Medicine Why:  Follow up with your therapist Christina Cox and with medication management. Contact information: [address] [place] [address] [phone]           Next level of care provider has access to [facility] Link:no  Safety Planning and Suicide Prevention discussed: No.  Declined  Have you used any form of tobacco in the last 30 days? (Cigarettes, Smokeless Tobacco, Cigars, and/or Pipes): No  Has patient been referred to the Quitline?: N/A patient is not a smoker  Patient has been referred for addiction treatment: N/A  Christina ChadMareida J Grossman-Orr, LCSW 10/24/2017, 1:58 PM

## 2018-03-30 DIAGNOSIS — Z79899 Other long term (current) drug therapy: Secondary | ICD-10-CM | POA: Diagnosis not present

## 2018-09-14 ENCOUNTER — Telehealth (HOSPITAL_COMMUNITY): Payer: Self-pay | Admitting: Psychiatry

## 2018-11-08 ENCOUNTER — Encounter (INDEPENDENT_AMBULATORY_CARE_PROVIDER_SITE_OTHER): Payer: Medicaid Other

## 2018-11-23 ENCOUNTER — Ambulatory Visit (INDEPENDENT_AMBULATORY_CARE_PROVIDER_SITE_OTHER): Payer: Medicaid Other | Admitting: Bariatrics

## 2018-11-23 ENCOUNTER — Encounter (INDEPENDENT_AMBULATORY_CARE_PROVIDER_SITE_OTHER): Payer: Self-pay | Admitting: Bariatrics

## 2018-11-23 VITALS — BP 110/77 | HR 75 | Temp 98.7°F | Ht 64.0 in | Wt 179.0 lb

## 2018-11-23 DIAGNOSIS — R5383 Other fatigue: Secondary | ICD-10-CM

## 2018-11-23 DIAGNOSIS — R9431 Abnormal electrocardiogram [ECG] [EKG]: Secondary | ICD-10-CM

## 2018-11-23 DIAGNOSIS — Z9189 Other specified personal risk factors, not elsewhere classified: Secondary | ICD-10-CM | POA: Diagnosis not present

## 2018-11-23 DIAGNOSIS — G4709 Other insomnia: Secondary | ICD-10-CM | POA: Diagnosis not present

## 2018-11-23 DIAGNOSIS — Z6830 Body mass index (BMI) 30.0-30.9, adult: Secondary | ICD-10-CM

## 2018-11-23 DIAGNOSIS — F3289 Other specified depressive episodes: Secondary | ICD-10-CM | POA: Diagnosis not present

## 2018-11-23 DIAGNOSIS — E669 Obesity, unspecified: Secondary | ICD-10-CM

## 2018-11-23 DIAGNOSIS — M549 Dorsalgia, unspecified: Secondary | ICD-10-CM

## 2018-11-23 DIAGNOSIS — R0602 Shortness of breath: Secondary | ICD-10-CM | POA: Diagnosis not present

## 2018-11-23 DIAGNOSIS — G43809 Other migraine, not intractable, without status migrainosus: Secondary | ICD-10-CM | POA: Diagnosis not present

## 2018-11-23 DIAGNOSIS — G8929 Other chronic pain: Secondary | ICD-10-CM

## 2018-11-23 DIAGNOSIS — Z0289 Encounter for other administrative examinations: Secondary | ICD-10-CM

## 2018-11-24 LAB — LIPID PANEL WITH LDL/HDL RATIO
Cholesterol, Total: 161 mg/dL (ref 100–199)
HDL: 52 mg/dL (ref >39)
LDL Calculated: 89 mg/dL (ref 0–99)
LDl/HDL Ratio: 1.7 ratio (ref 0.0–3.2)
Triglycerides: 98 mg/dL (ref 0–149)
VLDL Cholesterol Cal: 20 mg/dL (ref 5–40)

## 2018-11-24 LAB — CBC WITH DIFFERENTIAL
Basophils Absolute: 0 10*3/uL (ref 0.0–0.2)
Basos: 1 %
EOS (ABSOLUTE): 0.1 10*3/uL (ref 0.0–0.4)
Eos: 1 %
Hematocrit: 45.3 % (ref 34.0–46.6)
Hemoglobin: 15.3 g/dL (ref 11.1–15.9)
Immature Grans (Abs): 0 10*3/uL (ref 0.0–0.1)
Immature Granulocytes: 0 %
LYMPHS: 31 %
Lymphocytes Absolute: 2.6 10*3/uL (ref 0.7–3.1)
MCH: 29.6 pg (ref 26.6–33.0)
MCHC: 33.8 g/dL (ref 31.5–35.7)
MCV: 88 fL (ref 79–97)
Monocytes Absolute: 0.6 10*3/uL (ref 0.1–0.9)
Monocytes: 7 %
Neutrophils Absolute: 5 10*3/uL (ref 1.4–7.0)
Neutrophils: 60 %
RBC: 5.17 x10E6/uL (ref 3.77–5.28)
RDW: 12.1 % (ref 11.7–15.4)
WBC: 8.4 10*3/uL (ref 3.4–10.8)

## 2018-11-24 LAB — COMPREHENSIVE METABOLIC PANEL
ALT: 14 IU/L (ref 0–32)
AST: 17 IU/L (ref 0–40)
Albumin/Globulin Ratio: 1.9 (ref 1.2–2.2)
Albumin: 4.6 g/dL (ref 3.8–4.8)
Alkaline Phosphatase: 114 IU/L (ref 39–117)
BUN/Creatinine Ratio: 10 (ref 9–23)
BUN: 9 mg/dL (ref 6–20)
CHLORIDE: 102 mmol/L (ref 96–106)
CO2: 22 mmol/L (ref 20–29)
Calcium: 8.9 mg/dL (ref 8.7–10.2)
Creatinine, Ser: 0.92 mg/dL (ref 0.57–1.00)
GFR calc Af Amer: 91 mL/min/{1.73_m2} (ref >59)
GFR calc non Af Amer: 79 mL/min/{1.73_m2} (ref >59)
Globulin, Total: 2.4 g/dL (ref 1.5–4.5)
Glucose: 63 mg/dL — ABNORMAL LOW (ref 65–99)
Potassium: 4.5 mmol/L (ref 3.5–5.2)
Sodium: 140 mmol/L (ref 134–144)
Total Protein: 7 g/dL (ref 6.0–8.5)

## 2018-11-24 LAB — TSH: TSH: 0.974 u[IU]/mL (ref 0.450–4.500)

## 2018-11-24 LAB — INSULIN, RANDOM: INSULIN: 13.6 u[IU]/mL (ref 2.6–24.9)

## 2018-11-24 LAB — VITAMIN D 25 HYDROXY (VIT D DEFICIENCY, FRACTURES): Vit D, 25-Hydroxy: 32.2 ng/mL (ref 30.0–100.0)

## 2018-11-24 LAB — HEMOGLOBIN A1C
Est. average glucose Bld gHb Est-mCnc: 100 mg/dL
Hgb A1c MFr Bld: 5.1 % (ref 4.8–5.6)

## 2018-11-24 LAB — FOLATE: FOLATE: 7.7 ng/mL (ref >3.0)

## 2018-11-24 LAB — T3: T3, Total: 171 ng/dL (ref 71–180)

## 2018-11-24 LAB — T4, FREE: FREE T4: 1.01 ng/dL (ref 0.82–1.77)

## 2018-11-24 LAB — VITAMIN B12: Vitamin B-12: 176 pg/mL — ABNORMAL LOW (ref 232–1245)

## 2018-11-24 NOTE — Progress Notes (Signed)
.  Office: [phone]  /  Fax: [phone]   HPI:   Chief Complaint: OBESITY  Christina Cox (MR# [id number]) is a 38 y.o. female who presents on 11/23/2018 for obesity evaluation and treatment. Current BMI is Body mass index is 30.73 kg/m.Christina Sheldon. Christina Cox has struggled with obesity for years and has been unsuccessful in either losing weight or maintaining long term weight loss. Christina Cox attended our information session and states she is currently in the action stage of change and ready to dedicate time achieving and maintaining a healthier weight.  Christina Cox states her family eats meals together she thinks her family will eat healthier with  her her desired weight loss is 49 lbs. she started gaining weight after childbirth her heaviest weight ever was 187 lbs. she drinks regular soda daily She states she eats snack foods daily she has significant food cravings issues  she frequently makes poor food choices she has problems with excessive hunger  she has binge eating behaviors she struggles with emotional eating    Fatigue Christina Cox feels her energy is lower than it should be. This has worsened with weight gain and has not worsened recently. Christina Cox admits to daytime somnolence and she admits to waking up still tired. Patient is at risk for obstructive sleep apnea. Patent has a history of symptoms of daytime fatigue, morning fatigue and morning headache. Patient generally gets 5 hours of sleep per night, and states they generally have restless sleep. Snoring is not present. Apneic episodes are present. Epworth Sleepiness Score is 13  Dyspnea on exertion Christina Cox notes increasing shortness of breath with certain activities (climbing stairs) and seems to be worsening over time with weight gain. She notes getting out of breath sooner with activity than she used to. This has not gotten worse recently. Christina Cox denies orthopnea.  Insomnia Christina Cox has a diagnosis of insomnia and she is not on  medications.  Migraines Christina Cox has a history of migraines and she is currently taking Topamax.  Chronic Back Pain Christina Cox has had an increase in back pain with weight gain. She denies injury.  Abnormal EKG Christina Cox had 2-D echo per patient (normal) and she saw the cardiologist. ([facility]  At risk for cardiovascular disease Christina Cox is at a higher than average risk for cardiovascular disease due to obesity. She currently denies any chest pain.  Depression with emotional eating behaviors Christina Cox is struggling with emotional eating and using food for comfort to the extent that it is negatively impacting her health (eats when bored). She often snacks when she is not hungry. Christina Cox sometimes feels she is out of control and then feels guilty that she made poor food choices. She is attempting to work on behavior modification techniques to help reduce her emotional eating. She shows no sign of suicidal or homicidal ideations. PHQ-9 score is 17  Depression Screen Christina Cox's Food and Mood (modified PHQ-9) score was  Depression screen PHQ 2/9 11/23/2018  Decreased Interest 3  Down, Depressed, Hopeless 3  PHQ - 2 Score 6  Altered sleeping 2  Tired, decreased energy 3  Change in appetite 2  Feeling bad or failure about yourself  3  Trouble concentrating 1  Moving slowly or fidgety/restless 0  Suicidal thoughts 0  PHQ-9 Score 17  Difficult doing work/chores Very difficult    ASSESSMENT AND PLAN:  Other fatigue - Plan: EKG 12-Lead, Vitamin B12, CBC With Differential, Comprehensive metabolic panel, Folate, Hemoglobin A1c, Insulin, random, T3, T4, free, TSH, VITAMIN D 25 Hydroxy (  Vit-D Deficiency, Fractures)  Shortness of breath on exertion - Plan: Lipid Panel With LDL/HDL Ratio  Other insomnia  Other migraine without status migrainosus, not intractable  Other chronic back pain  Abnormal EKG - Plan: Lipid Panel With LDL/HDL Ratio  Other depression - with emotional  eating  At risk for heart disease  Class 1 obesity with serious comorbidity and body mass index (BMI) of 30.0 to 30.9 in adult, unspecified obesity type  PLAN:  Fatigue Christina Cox was informed that her fatigue may be related to obesity, depression or many other causes. Labs will be ordered, and in the meanwhile Christina Cox has agreed to work on diet, exercise and weight loss to help with fatigue. Proper sleep hygiene was discussed including the need for 7-8 hours of quality sleep each night. A sleep study was not ordered based on symptoms and Epworth score.  Dyspnea on exertion Christina Cox's shortness of breath appears to be obesity related and exercise induced. She has agreed to work on weight loss and gradually increase exercise to treat her exercise induced shortness of breath. If Christina Cox follows our instructions and loses weight without improvement of her shortness of breath, we will plan to refer to pulmonology. We will monitor this condition regularly. Christina Cox agrees to this plan.  Insomnia We discussed sleep hygiene today, including the need for 7 to 8 hours of quality sleep each night.  Migraines [name] will continue her medications and will follow up with our clinic in 2 weeks.  Chronic Back Pain Christina Cox will gradually increase exercise. She will not do stretching.  Abnormal EKG Christina Cox was cleared per her cardiologist and no follow up is needed. Christina Cox will follow up with our clinic in 2 weeks.  Cardiovascular risk counseling Christina Cox was given extended (15 minutes) coronary artery disease prevention counseling today. She is 38 y.o. female and has risk factors for heart disease including obesity. We discussed intensive lifestyle modifications today with an emphasis on specific weight loss instructions and strategies. Pt was also informed of the importance of increasing exercise and decreasing saturated fats to help prevent heart disease.  Depression with Emotional Eating Behaviors We discussed  behavior modification techniques today to help Fayette deal with her emotional eating and depression. We will refer Christina Sheldon to Dr. Dewaine Conger our bariatric psychologist.  Depression Screen Meriem had a strongly positive depression screening. Depression is commonly associated with obesity and often results in emotional eating behaviors. We will monitor this closely and work on CBT to help improve the non-hunger eating patterns.   Obesity Demiya is currently in the action stage of change and her goal is to continue with weight loss efforts She has agreed to follow the Category 2 plan +100 calories Torrie has been instructed to work up to a goal of 150 minutes of combined cardio and strengthening exercise per week for weight loss and overall health benefits. We discussed the following Behavioral Modification Strategies today: increase H2O intake, keeping healthy foods in the home, increasing lean protein intake, decreasing simple carbohydrates, increasing vegetables and work on meal planning and easy cooking plans  Janella has agreed to follow up with our clinic in 2 weeks. She was informed of the importance of frequent follow up visits to maximize her success with intensive lifestyle modifications for her multiple health conditions. She was informed we would discuss her lab results at her next visit unless there is a critical issue that needs to be addressed sooner. Eryka agreed to keep her next visit at the agreed upon time  to discuss these results.  ALLERGIES: No Known Allergies  MEDICATIONS: Current Outpatient Medications on File Prior to Visit  Medication Sig Dispense Refill  . clonazePAM (KLONOPIN) 0.5 MG tablet Take 0.5 mg by mouth 2 (two) times daily as needed for anxiety.    . lamoTRIgine (LAMICTAL) 100 MG tablet Take 1 tablet (100 mg total) by mouth every evening. 30 tablet 0  . lurasidone (LATUDA) 20 MG TABS tablet Take 1 tablet (20 mg total) by mouth daily with supper. For mood control 30  tablet 0  . topiramate (TOPAMAX) 100 MG tablet Take 1 tablet (100 mg total) by mouth 2 (two) times daily. 30 tablet 0   No current facility-administered medications on file prior to visit.     PAST MEDICAL HISTORY: Past Medical History:  Diagnosis Date  . Anxiety   . Back pain   . Chest pain   . Chronic migraine   . Constipation   . Depression   . Depression   . Headache   . IBS (irritable bowel syndrome)   . Interstitial cystitis   . Knee pain   . Shortness of breath   . Stiff neck   . Swelling of left lower extremity   . Swelling of lower extremity   . Trouble in sleeping   . Wears glasses     PAST SURGICAL HISTORY: Past Surgical History:  Procedure Laterality Date  . BREAST ENHANCEMENT SURGERY Bilateral 2012  . BREAST SURGERY     implants  . BUNIONECTOMY Right age 18  . BUNIONECTOMY    . CESAREAN SECTION N/A 12/26/2016   Procedure: CESAREAN SECTION;  Surgeon: Philip Aspen, DO;  Location: [facility];  Service: Obstetrics;  Laterality: N/A;  . CYSTO WITH HYDRODISTENSION N/A 10/13/2013   Procedure: CYSTOSCOPY/HYDRODISTENSION;  Surgeon: Crist Fat, MD;  Location: [facility];  Service: Urology;  Laterality: N/A;  . INTRAUTERINE DEVICE (IUD) INSERTION  2013   mirena  . WISDOM TOOTH EXTRACTION  age 14    SOCIAL HISTORY: Social History   Tobacco Use  . Smoking status: Former Games developer  . Smokeless tobacco: Never Used  Substance Use Topics  . Alcohol use: No  . Drug use: No    FAMILY HISTORY: Family History  Problem Relation Age of Onset  . Hypertension Father   . Heart attack Father   . Hyperlipidemia Father   . Heart disease Father   . Stroke Father   . Migraines Mother   . Alcohol abuse Mother   . Depression Mother   . Mental illness Mother   . Cancer Mother   . Depression Maternal Aunt   . Depression Maternal Grandmother   . Cancer Maternal Grandmother        lung cancer-smoker    ROS: Review of Systems  Constitutional: Positive for  malaise/fatigue.  Cardiovascular: Negative for chest pain and orthopnea.  Musculoskeletal: Positive for back pain.       + Neck Stiffness  Skin:       + Hair or Nail Changes  Neurological: Positive for headaches.  Psychiatric/Behavioral: Positive for depression. Negative for suicidal ideas. The patient has insomnia.     PHYSICAL EXAM: Blood pressure 110/77, pulse 75, temperature 98.7 F (37.1 C), temperature source Oral, height 5\' 4"  (1.626 m), weight 179 lb (81.2 kg), SpO2 97 %, unknown if currently breastfeeding. Body mass index is 30.73 kg/m. Physical Exam Vitals signs reviewed.  Constitutional:      Appearance: Normal appearance. She is well-developed. She is obese.  HENT:     Head: Normocephalic and atraumatic.     Nose: Nose normal.  Eyes:     General: No scleral icterus.    Extraocular Movements: Extraocular movements intact.  Neck:     Musculoskeletal: Normal range of motion and neck supple.     Thyroid: No thyromegaly.  Cardiovascular:     Rate and Rhythm: Normal rate and regular rhythm.  Pulmonary:     Effort: Pulmonary effort is normal. No respiratory distress.  Abdominal:     Palpations: Abdomen is soft.     Tenderness: There is no abdominal tenderness.  Musculoskeletal: Normal range of motion.     Comments: Range of Motion normal in all 4 extremities  Skin:    General: Skin is warm and dry.  Neurological:     Mental Status: She is alert and oriented to person, place, and time.     Coordination: Coordination normal.  Psychiatric:        Mood and Affect: Mood normal.        Behavior: Behavior normal.        Thought Content: Thought content does not include homicidal or suicidal ideation.     RECENT LABS AND TESTS: BMET    Component Value Date/Time   NA 140 11/23/2018 1242   K 4.5 11/23/2018 1242   CL 102 11/23/2018 1242   CO2 22 11/23/2018 1242   GLUCOSE 63 (L) 11/23/2018 1242   GLUCOSE 122 (H) 10/21/2017 0739   BUN 9 11/23/2018 1242   CREATININE  0.92 11/23/2018 1242   CALCIUM 8.9 11/23/2018 1242   GFRNONAA 79 11/23/2018 1242   GFRAA 91 11/23/2018 1242   Lab Results  Component Value Date   HGBA1C 5.1 11/23/2018   Lab Results  Component Value Date   INSULIN 13.6 11/23/2018   CBC    Component Value Date/Time   WBC 8.4 11/23/2018 1242   WBC 8.6 10/21/2017 0739   RBC 5.17 11/23/2018 1242   RBC 5.13 (H) 10/21/2017 0739   HGB 15.3 11/23/2018 1242   HCT 45.3 11/23/2018 1242   PLT 393 10/21/2017 0739   MCV 88 11/23/2018 1242   MCH 29.6 11/23/2018 1242   MCH 27.3 10/21/2017 0739   MCHC 33.8 11/23/2018 1242   MCHC 32.0 10/21/2017 0739   RDW 12.1 11/23/2018 1242   LYMPHSABS 2.6 11/23/2018 1242   MONOABS 0.9 07/09/2008 1238   EOSABS 0.1 11/23/2018 1242   BASOSABS 0.0 11/23/2018 1242   Iron/TIBC/Ferritin/ %Sat No results found for: IRON, TIBC, FERRITIN, IRONPCTSAT Lipid Panel     Component Value Date/Time   CHOL 161 11/23/2018 1242   TRIG 98 11/23/2018 1242   HDL 52 11/23/2018 1242   CHOLHDL 4.5 10/21/2017 0739   VLDL 40 10/21/2017 0739   LDLCALC 89 11/23/2018 1242   Hepatic Function Panel     Component Value Date/Time   PROT 7.0 11/23/2018 1242   ALBUMIN 4.6 11/23/2018 1242   AST 17 11/23/2018 1242   ALT 14 11/23/2018 1242   ALKPHOS 114 11/23/2018 1242   BILITOT <0.2 11/23/2018 1242      Component Value Date/Time   TSH 0.974 11/23/2018 1242   Vitamin D There are no recent lab results  ECG  shows NSR with a rate of 75 BPM INDIRECT CALORIMETER done today shows a VO2 of 204 and a REE of 1420. Her calculated basal metabolic rate is [id number] thus her basal metabolic rate is worse than expected.       OBESITY BEHAVIORAL  INTERVENTION VISIT  Today's visit was # 1   Starting weight: 179 lbs Starting date: 11/23/2018 Today's weight : 179 lbs Today's date: 11/23/2018 Total lbs lost to date: 0   ASK: We discussed the diagnosis of obesity with Christina Cox today and Marivel agreed to give [place] permission  to discuss obesity behavioral modification therapy today.  ASSESS: Daleigh has the diagnosis of obesity and her BMI today is 30.71 Amani is in the action stage of change   ADVISE: Bernardina was educated on the multiple health risks of obesity as well as the benefit of weight loss to improve her health. She was advised of the need for long term treatment and the importance of lifestyle modifications to improve her current health and to decrease her risk of future health problems.  AGREE: Multiple dietary modification options and treatment options were discussed and  Joshua agreed to follow the recommendations documented in the above note.  ARRANGE: Niza was educated on the importance of frequent visits to treat obesity as outlined per CMS and USPSTF guidelines and agreed to schedule her next follow up appointment today.   Cristi Loron, am acting as Energy manager for [employer]. Manson Passey, DO  I have reviewed the above documentation for accuracy and completeness, and I agree with the above. -Corinna Capra, DO

## 2018-11-29 ENCOUNTER — Encounter (INDEPENDENT_AMBULATORY_CARE_PROVIDER_SITE_OTHER): Payer: Self-pay | Admitting: Bariatrics

## 2018-12-07 ENCOUNTER — Ambulatory Visit (INDEPENDENT_AMBULATORY_CARE_PROVIDER_SITE_OTHER): Payer: Medicaid Other | Admitting: Bariatrics

## 2018-12-07 ENCOUNTER — Encounter (INDEPENDENT_AMBULATORY_CARE_PROVIDER_SITE_OTHER): Payer: Self-pay | Admitting: Bariatrics

## 2018-12-07 ENCOUNTER — Other Ambulatory Visit (INDEPENDENT_AMBULATORY_CARE_PROVIDER_SITE_OTHER): Payer: Self-pay | Admitting: Bariatrics

## 2018-12-07 VITALS — BP 111/78 | HR 65 | Temp 97.8°F | Ht 64.0 in | Wt 177.0 lb

## 2018-12-07 DIAGNOSIS — F3289 Other specified depressive episodes: Secondary | ICD-10-CM

## 2018-12-07 DIAGNOSIS — E559 Vitamin D deficiency, unspecified: Secondary | ICD-10-CM

## 2018-12-07 DIAGNOSIS — Z6830 Body mass index (BMI) 30.0-30.9, adult: Secondary | ICD-10-CM | POA: Diagnosis not present

## 2018-12-07 DIAGNOSIS — Z9189 Other specified personal risk factors, not elsewhere classified: Secondary | ICD-10-CM | POA: Diagnosis not present

## 2018-12-07 DIAGNOSIS — E8881 Metabolic syndrome: Secondary | ICD-10-CM | POA: Diagnosis not present

## 2018-12-07 DIAGNOSIS — E669 Obesity, unspecified: Secondary | ICD-10-CM | POA: Diagnosis not present

## 2018-12-07 MED ORDER — VITAMIN D (ERGOCALCIFEROL) 1.25 MG (50000 UNIT) PO CAPS: 50000.0000 [IU] | ORAL_CAPSULE | ORAL | 0 refills | Status: DC

## 2018-12-07 NOTE — Progress Notes (Addendum)
Office: [phone]  /  Fax: [phone]    Date: December 08, 2018  Time Seen: 10:00am Duration: 56 minutes Provider: Glennie Isle, PsyD Type of Session: Intake for Individual Therapy  Type of Contact: Face-to-face  Informed Consent:The provider's role was explained to [employer]. The provider reviewed and discussed issues of confidentiality, privacy, and limits therein. In addition to verbal informed consent, written informed consent for psychological services was obtained from [place] prior to the initial intake interview. Written consent included information concerning the practice, financial arrangements, and confidentiality and patients' rights. Since the clinic is not a 24/7 crisis center, mental health emergency resources were shared in the form of a handout, and the provider explained MyChart, e-mail, voicemail, and/or other messaging systems should be utilized only for non-emergency reasons. Sherita verbally acknowledged understanding of the aforementioned, and agreed to use mental health emergency resources discussed if needed. Moreover, Elayah agreed information may be shared with other [facility] providers as needed for coordination of care, and written consent was obtained.   Chief Complaint: Gerry was referred by Dr. Jearld Lesch due to depression with emotional eating behaviors. Per the note for the visit with Dr. Jearld Lesch on November 23, 2018, "Colena is struggling with emotional eating and using food for comfort to the extent that it is negatively impacting her health (eats when bored). She often snacks when she is not hungry. Dietrich sometimes feels she is out of control and then feels guilty that she made poor food choices. She is attempting to work on behavior modification techniques to help reduce her emotional eating. She shows no sign of suicidal or homicidal ideations. PHQ-9 score is 17."  During today's appointment, Steffany reported the  structured meal plan is going "really well so far." She noted, "I'm doing about 75% of what I'm supposed to be doing. My soda intake is what is really hard for me." She further explained, "I never learned about nutrition." Chandlar noted her last episode of emotional eating was prior to starting with the clinic. She reported she tends to crave sugar.   Demetrica was asked to complete a questionnaire assessing various behaviors related to emotional eating. Breeona endorsed the following: experience food cravings on a regular basis, eat certain foods when you are anxious, stressed, depressed, or your feelings are hurt, overeat frequently when you are bored or lonely and overeat when you are alone, but eat much less when you are with other people.  HPI: Per the note for the initial visit with Dr. Jearld Lesch on November 23, 2018, Shontia shared she drinks regular soda daily, and she eats snack foods daily. During the initial appointment with Dr. Jearld Lesch, Caryl Pina further reported experiencing the following: significant food cravings issues , frequently making poor food choices, binge eating behaviors, struggling with emotional eating and having problems with excessive hunger.   During today's appointment, Jermiya reported the onset of emotional eating was after having her second child. She denied a history of binge eating, but described overeating. She denied a history of purging and engagement in other compensatory strategies, and has never been diagnosed with an eating disorder.  Mental Status Examination: Vanesha arrived on time for the appointment. She presented as appropriately dressed and groomed. Anahla appeared her stated age and demonstrated adequate orientation to time, place, person, and purpose of the appointment. She also demonstrated appropriate eye contact. No psychomotor abnormalities or behavioral peculiarities noted. Her mood was euthymic with congruent affect. Her thought processes were logical,  linear, and goal-directed. No hallucinations, delusions, bizarre thinking or behavior reported or observed. Notably, she appeared to be an unreliable historian as evidenced by her self-report during today's appointment and the inconsistencies noted per chart review. There was no evidence of paraphasias (i.e., errors in speech, gross mispronunciations, and word substitutions), repetition deficits, or disturbances in volume or prosody (i.e., rhythm and intonation). There was no evidence of attention or memory impairments. Wanetta denied current suicidal and homicidal ideation, plan, and intent.   Family & Psychosocial History: Stella stated she has been married to her current husband for 8 years, and she was previously married for 5 years. She shared she has two sons (ages 39, and 1). 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 She stated her social support system consists of her husband, and children. Rolanda shared she does not identify with any religion or spirituality.   Medical History:  Past Medical History:  Diagnosis Date  . Anxiety   . Back pain   . Chest pain   . Chronic migraine   . Constipation   . Depression   . Depression   . Headache   . IBS (irritable bowel syndrome)   . Interstitial cystitis   . Knee pain   . Shortness of breath   . Stiff neck   . Swelling of left lower extremity   . Swelling of lower extremity   . Trouble in sleeping   . Wears glasses    Past Surgical History:  Procedure Laterality Date  . BREAST ENHANCEMENT SURGERY Bilateral 2012  . BREAST SURGERY     implants  . BUNIONECTOMY Right age 38  . BUNIONECTOMY    . CESAREAN SECTION N/A 12/26/2016   Procedure: CESAREAN SECTION;  Surgeon: Allyn Kenner, DO;  Location: [facility];  Service: Obstetrics;  Laterality: N/A;  . CYSTO WITH HYDRODISTENSION N/A 10/13/2013   Procedure: CYSTOSCOPY/HYDRODISTENSION;  Surgeon: Ardis Hughs, MD;   Location: [facility];  Service: Urology;  Laterality: N/A;  . INTRAUTERINE DEVICE (IUD) INSERTION  2013   [facility]  . WISDOM TOOTH EXTRACTION  age 4   Current Outpatient Medications on File Prior to Visit  Medication Sig Dispense Refill  . clonazePAM (KLONOPIN) 0.5 MG tablet Take 0.5 mg by mouth 2 (two) times daily as needed for anxiety.    . lamoTRIgine (LAMICTAL) 100 MG tablet Take 1 tablet (100 mg total) by mouth every evening. 30 tablet 0  . lurasidone (LATUDA) 20 MG TABS tablet Take 1 tablet (20 mg total) by mouth daily with supper. For mood control 30 tablet 0  . topiramate (TOPAMAX) 100 MG tablet Take 1 tablet (100 mg total) by mouth 2 (two) times daily. 30 tablet 0  . Vitamin D, Ergocalciferol, (DRISDOL) 1.25 MG (50000 UT) CAPS capsule Take 1 capsule (50,000 Units total) by mouth every 7 (seven) days. 4 capsule 0   No current facility-administered medications on file prior to visit.   Irva denied a history of head injuries and loss of consciousness.   Mental Health History: Clorissa first received therapeutic services in the form of individual therapy after her parents passed away. She noted she attended therapy for "several years." Britteny was diagnosed with PTSD. She explained her mother had cancer, and her father likely had a heart attack at work. They passed away three years apart. She shared she has met with a therapist "on and off" her whole life. Chevelle noted she has never  met with a psychiatrist. She explained her current primary care physician prescribes Klonopin, Lamictal, and Latuda. She noted they were prescribed approximately 1 year ago. She explained she was prescribed Klonopin to help with anxiety. She noted Lamictal was prescribed "for [her] moods." More specifically, she indicated she experienced "ups and downs" due to "too much pressure." This resulted in angry outbursts that were verbal in nature. She denied ever experiencing physical outbursts. She indicated she does not know  why she was prescribed Latuda. She described the medications as "helpful." Thalya shared she currently has a therapist named Benjamine Mola who is employed with a [employer] [place]; she could not recall the name of the practice or Elizabeth's last name. She noted they have been working together for approximately one year. She added, "She did not diagnose me with anything." Erik indicated she meets with her therapist "bi-weekly" for talk therapy. They shared they focus on "every day life." Shahira clarified they do not discuss eating habits. She noted she will inform Benjamine Mola at their next appointment about meeting with this provider, which is next week. It was recommended an Authorization be completed to coordinate care. Ashara declined, and noted she did not want to sign an Authorization to coordinate care, and noted, "I'll just let her know." Moreover, Nikeia explained she initiated therapeutic services with Benjamine Mola approximately 1 years ago as she "just wanted to get back into therapy." Valia explained she does not have much interaction with others; therefore, wanted someone to talk to. She denied a history of hospitalizations for psychiatric concerns and also denied a family history of mental health related concerns. Furthermore, Billee denied a trauma history, including psychological, physical  and sexual abuse, as well as neglect.   Typically, Dahiana reported her mood is "pretty normal." Initially she indicated experiencing hopelessness but explained it is more worry related to meeting her weight loss goals. She noted difficulty staying asleep and stated she averages 4 hours of sleep a night.  She also endorsed attention and concentration issues, but attributed them to worry related to her weight. She also endorsed experiencing social withdrawal due to her young son as well as weight. Eustacia further endorsed the following: anhedonia, crying spells, depressed mood, fatigue, overeating, decreased self-esteem  due to weight, restlessness, and decreased motivation.   Shaylee denied experiencing the following: memory concerns, irritability, obsessions and compulsions, mania, angry outbursts, and panic attacks. She denied experiencing any symptoms currently related to PTSD, including nightmares and flashbacks. Darnita reported a history of alcohol use in her 40s, but denied any substance use currently. She also denied a history of hallucinations and delusions as well as paranoia. Moreover, she denied history of and current suicidal ideation, plan, and intent; history of and current homicidal ideation, plan, and intent; and history of and current engagement in self-harm. Notably, chart review revealed a history of voluntary hospitalization for suicidal ideation in January of 2019 as well as a history of two suicide attempts. Notes also indicated Caryl Pina received psychiatric services, and attended group therapy. Moreover, she also reported experiencing hallucinations during that hospitalization.   The following strengths were reported by Caryl Pina: great personality, good mother, good wife, caring, and family oriented the following strengths were observed by this provider: ability to express thoughts and feelings during the therapeutic session, ability to establish and benefit from a therapeutic relationship, ability to learn and practice coping skills, willingness to work toward established goal(s) with the clinic and ability to engage in reciprocal conversation.  Legal History: Moranda denied a  history of legal involvement.   Structured Assessment Results: The Patient Health Questionnaire-9 (PHQ-9) is a self-report measure that assesses symptoms and severity of depression over the course of the last two weeks. Jeanean obtained a score of 9 suggesting mild depression. Tammy finds the endorsed symptoms to be somewhat difficult. Depression screen PHQ 2/9 12/08/2018  Decreased Interest 1  Down, Depressed, Hopeless 1  PHQ - 2  Score 2  Altered sleeping 1  Tired, decreased energy 1  Change in appetite 2  Feeling bad or failure about yourself  1  Trouble concentrating 1  Moving slowly or fidgety/restless 1  Suicidal thoughts 0  PHQ-9 Score 9  Difficult doing work/chores -   The Generalized Anxiety Disorder-7 (GAD-7) is a brief self-report measure that assesses symptoms of anxiety over the course of the last two weeks. Loyalty obtained a score of 1 suggesting minimal anxiety. GAD 7 : Generalized Anxiety Score 12/08/2018  Nervous, Anxious, on Edge 0  Control/stop worrying 0  Worry too much - different things 0  Trouble relaxing 1  Restless 0  Easily annoyed or irritable 0  Afraid - awful might happen 0  Total GAD 7 Score 1  Anxiety Difficulty Somewhat difficult   Interventions: A chart review was conducted. The PHQ-9, and GAD-7 were administered and a clinical intake interview was completed. In addition, Clotilde was asked to complete a Mood and Food questionnaire to assess various behaviors related to emotional eating. Throughout session, empathic reflections and validation was provided. Continuing treatment with this provider was discussed and a treatment goal was established. Psychoeducation regarding emotional versus physical hunger was provided. Maisyn was given a handout to utilize between now and the next appointment to increase awareness of hunger patterns and subsequent eating.   Provisional DSM-5 Diagnosis: 311 (F32.8) Other Specified Depressive Disorder, Emotional Eating Behaviors  Plan: Daphanie appears able and willing to participate as evidenced by collaboration on a treatment goal, engagement in reciprocal conversation, and asking questions as needed for clarification. The next appointment will be scheduled in two weeks. The following treatment goal was established: decrease emotional eating. For the aforementioned goal, Elanora can benefit from biweekly individual therapy sessions that are brief in duration  for approximately four to six sessions. The treatment modality will be individual therapeutic services, including an eclectic therapeutic approach utilizing techniques from Cognitive Behavioral Therapy, Patient Centered Therapy, Dialectical Behavior Therapy, Acceptance and Commitment Therapy, Interpersonal Therapy, and Cognitive Restructuring. Therapeutic approach will include various interventions as appropriate, such as validation, support, mindfulness, thought defusion, reframing, psychoeducation, values assessment, and role playing. This provider will regularly review the treatment plan and medical chart to keep informed of status changes. Refugio expressed understanding and agreement with the initial treatment plan of care. Based on Shebra's history revealed per chart review, this provider will complete a risk assessment and continue monitoring for safety at future appointments. This provider will also further discuss coordination of care with her other therapist, Benjamine Mola. Additionally, inconsistencies in chart review and Lachele's self-report will be explored during the next appointment.

## 2018-12-08 ENCOUNTER — Ambulatory Visit (INDEPENDENT_AMBULATORY_CARE_PROVIDER_SITE_OTHER): Payer: Medicaid Other | Admitting: Psychology

## 2018-12-08 DIAGNOSIS — F3289 Other specified depressive episodes: Secondary | ICD-10-CM

## 2018-12-08 DIAGNOSIS — E8881 Metabolic syndrome: Secondary | ICD-10-CM | POA: Insufficient documentation

## 2018-12-08 DIAGNOSIS — E559 Vitamin D deficiency, unspecified: Secondary | ICD-10-CM | POA: Insufficient documentation

## 2018-12-08 NOTE — Progress Notes (Signed)
Office: [phone]  /  Fax: [phone]   HPI:   Chief Complaint: OBESITY Christina Cox is here to discuss her progress with her obesity treatment plan. She is on the Category 2 plan +100 calories and is following her eating plan approximately 75 % of the time. She states she is exercising 0 minutes 0 times per week. Christina Cox states that she does not like "microwave meals". She liked the snack ideas and she liked eating the vegetables in the [facility]. Her weight is 177 lb (80.3 kg) today and has had a weight loss of 2 pounds over a period of 2 weeks since her last visit. She has lost 2 lbs since starting treatment with [place].  Vitamin D deficiency Christina Cox has a diagnosis of vitamin D deficiency. She is not currently taking vit D and denies nausea, vomiting or muscle weakness.  At risk for osteopenia and osteoporosis Christina Cox is at higher risk of osteopenia and osteoporosis due to vitamin D deficiency.   Insulin Resistance Christina Cox has a diagnosis of insulin resistance based on her elevated fasting insulin level >5. Although Christina Cox's blood glucose readings are still under good control, insulin resistance puts her at greater risk of metabolic syndrome and diabetes. Her last A1c was at 5.1 and last insulin level was at 13.6 She is not taking metformin currently and continues to work on diet and exercise to decrease risk of diabetes.  Depression with emotional eating behaviors Christina Cox is struggling with emotional eating and using food for comfort to the extent that it is negatively impacting her health. She often snacks when she is not hungry. Christina Cox sometimes feels she is out of control and then feels guilty that she made poor food choices. She has been working on behavior modification techniques to help reduce her emotional eating and has been somewhat successful. She shows no sign of suicidal or homicidal ideations.  Depression screen PHQ 2/9 11/23/2018  Decreased Interest 3  Down, Depressed, Hopeless 3    PHQ - 2 Score 6  Altered sleeping 2  Tired, decreased energy 3  Change in appetite 2  Feeling bad or failure about yourself  3  Trouble concentrating 1  Moving slowly or fidgety/restless 0  Suicidal thoughts 0  PHQ-9 Score 17  Difficult doing work/chores Very difficult    ASSESSMENT AND PLAN:  Vitamin D deficiency - Plan: Vitamin D, Ergocalciferol, (DRISDOL) 1.25 MG (50000 UT) CAPS capsule  Other depression  Insulin resistance  At risk for osteoporosis  Class 1 obesity with serious comorbidity and body mass index (BMI) of 30.0 to 30.9 in adult, unspecified obesity type  PLAN:  Vitamin D Deficiency Paying was informed that low vitamin D levels contributes to fatigue and are associated with obesity, breast, and colon cancer. She agrees to start prescription Vit D @50 ,000 IU every week #4 with no refills and will follow up for routine testing of vitamin D, at least 2-3 times per year. She was informed of the risk of over-replacement of vitamin D and agrees to not increase her dose unless she discusses this with [place] first. Christina Cox agrees to follow up as directed.  At risk for osteopenia and osteoporosis Christina Cox was given extended  (15 minutes) osteoporosis prevention counseling today. Christina Cox is at risk for osteopenia and osteoporosis due to her vitamin D deficiency. She was encouraged to take her vitamin D and follow her higher calcium diet and increase strengthening exercise to help strengthen her bones and decrease her risk of osteopenia and osteoporosis.  Insulin  Resistance Christina Cox will continue to work on weight loss, exercise, increasing lean protein and decreasing simple carbohydrates in her diet to help decrease the risk of diabetes. She was informed that eating too many simple carbohydrates or too many calories at one sitting increases the likelihood of GI side effects. Christina Cox agreed to follow up with [place] as directed to monitor her progress.  Depression with Emotional Eating  Behaviors We discussed behavior modification techniques today to help Christina Cox deal with her emotional eating and depression.   Obesity Christina Cox is currently in the action stage of change. As such, her goal is to continue with weight loss efforts She has agreed to follow the Category 2 plan +100 calories Christina Cox has been instructed to work up to a goal of 150 minutes of combined cardio and strengthening exercise per week for weight loss and overall health benefits. We discussed the following Behavioral Modification Strategies today: increase H2O intake, no skipping meals, keeping healthy foods in the home, increasing lean protein intake, decreasing simple carbohydrates, increasing vegetables, decrease eating out and work on meal planning and easy cooking plans  Christina Cox has agreed to follow up with our clinic in 2 weeks. She was informed of the importance of frequent follow up visits to maximize her success with intensive lifestyle modifications for her multiple health conditions.  ALLERGIES: No Known Allergies  MEDICATIONS: Current Outpatient Medications on File Prior to Visit  Medication Sig Dispense Refill  . clonazePAM (KLONOPIN) 0.5 MG tablet Take 0.5 mg by mouth 2 (two) times daily as needed for anxiety.    . lamoTRIgine (LAMICTAL) 100 MG tablet Take 1 tablet (100 mg total) by mouth every evening. 30 tablet 0  . lurasidone (LATUDA) 20 MG TABS tablet Take 1 tablet (20 mg total) by mouth daily with supper. For mood control 30 tablet 0  . topiramate (TOPAMAX) 100 MG tablet Take 1 tablet (100 mg total) by mouth 2 (two) times daily. 30 tablet 0   No current facility-administered medications on file prior to visit.     PAST MEDICAL HISTORY: Past Medical History:  Diagnosis Date  . Anxiety   . Back pain   . Chest pain   . Chronic migraine   . Constipation   . Depression   . Depression   . Headache   . IBS (irritable bowel syndrome)   . Interstitial cystitis   . Knee pain   . Shortness  of breath   . Stiff neck   . Swelling of left lower extremity   . Swelling of lower extremity   . Trouble in sleeping   . Wears glasses     PAST SURGICAL HISTORY: Past Surgical History:  Procedure Laterality Date  . BREAST ENHANCEMENT SURGERY Bilateral 2012  . BREAST SURGERY     implants  . BUNIONECTOMY Right age 71  . BUNIONECTOMY    . CESAREAN SECTION N/A 12/26/2016   Procedure: CESAREAN SECTION;  Surgeon: Philip Aspen, DO;  Location: [facility];  Service: Obstetrics;  Laterality: N/A;  . CYSTO WITH HYDRODISTENSION N/A 10/13/2013   Procedure: CYSTOSCOPY/HYDRODISTENSION;  Surgeon: Crist Fat, MD;  Location: [facility] ORS;  Service: Urology;  Laterality: N/A;  . INTRAUTERINE DEVICE (IUD) INSERTION  2013   mirena  . WISDOM TOOTH EXTRACTION  age 39    SOCIAL HISTORY: Social History   Tobacco Use  . Smoking status: Former Games developer  . Smokeless tobacco: Never Used  Substance Use Topics  . Alcohol use: No  . Drug use: No  FAMILY HISTORY: Family History  Problem Relation Age of Onset  . Hypertension Father   . Heart attack Father   . Hyperlipidemia Father   . Heart disease Father   . Stroke Father   . Migraines Mother   . Alcohol abuse Mother   . Depression Mother   . Mental illness Mother   . Cancer Mother   . Depression Maternal Aunt   . Depression Maternal Grandmother   . Cancer Maternal Grandmother        lung cancer-smoker    ROS: Review of Systems  Constitutional: Positive for weight loss.  Gastrointestinal: Negative for nausea and vomiting.  Musculoskeletal:       Negative for muscle weakness  Psychiatric/Behavioral: Positive for depression.    PHYSICAL EXAM: Blood pressure 111/78, pulse 65, temperature 97.8 F (36.6 C), temperature source Oral, height 5\' 4"  (1.626 m), weight 177 lb (80.3 kg), last menstrual period 12/02/2018, SpO2 97 %, unknown if currently breastfeeding. Body mass index is 30.38 kg/m. Physical Exam Vitals signs  reviewed.  Constitutional:      Appearance: Normal appearance. She is well-developed. She is obese.  Cardiovascular:     Rate and Rhythm: Normal rate.  Pulmonary:     Effort: Pulmonary effort is normal.  Musculoskeletal: Normal range of motion.  Skin:    General: Skin is warm and dry.  Neurological:     Mental Status: She is alert and oriented to person, place, and time.  Psychiatric:        Mood and Affect: Mood normal.        Behavior: Behavior normal.     RECENT LABS AND TESTS: BMET    Component Value Date/Time   NA 140 11/23/2018 1242   K 4.5 11/23/2018 1242   CL 102 11/23/2018 1242   CO2 22 11/23/2018 1242   GLUCOSE 63 (L) 11/23/2018 1242   GLUCOSE 122 (H) 10/21/2017 0739   BUN 9 11/23/2018 1242   CREATININE 0.92 11/23/2018 1242   CALCIUM 8.9 11/23/2018 1242   GFRNONAA 79 11/23/2018 1242   GFRAA 91 11/23/2018 1242   Lab Results  Component Value Date   HGBA1C 5.1 11/23/2018   HGBA1C 5.1 10/21/2017   Lab Results  Component Value Date   INSULIN 13.6 11/23/2018   CBC    Component Value Date/Time   WBC 8.4 11/23/2018 1242   WBC 8.6 10/21/2017 0739   RBC 5.17 11/23/2018 1242   RBC 5.13 (H) 10/21/2017 0739   HGB 15.3 11/23/2018 1242   HCT 45.3 11/23/2018 1242   PLT 393 10/21/2017 0739   MCV 88 11/23/2018 1242   MCH 29.6 11/23/2018 1242   MCH 27.3 10/21/2017 0739   MCHC 33.8 11/23/2018 1242   MCHC 32.0 10/21/2017 0739   RDW 12.1 11/23/2018 1242   LYMPHSABS 2.6 11/23/2018 1242   MONOABS 0.9 07/09/2008 1238   EOSABS 0.1 11/23/2018 1242   BASOSABS 0.0 11/23/2018 1242   Iron/TIBC/Ferritin/ %Sat No results found for: IRON, TIBC, FERRITIN, IRONPCTSAT Lipid Panel     Component Value Date/Time   CHOL 161 11/23/2018 1242   TRIG 98 11/23/2018 1242   HDL 52 11/23/2018 1242   CHOLHDL 4.5 10/21/2017 0739   VLDL 40 10/21/2017 0739   LDLCALC 89 11/23/2018 1242   Hepatic Function Panel     Component Value Date/Time   PROT 7.0 11/23/2018 1242   ALBUMIN 4.6  11/23/2018 1242   AST 17 11/23/2018 1242   ALT 14 11/23/2018 1242   ALKPHOS 114 11/23/2018  1242   BILITOT <0.2 11/23/2018 1242      Component Value Date/Time   TSH 0.974 11/23/2018 1242   TSH <0.010 (L) 10/21/2017 0739     Ref. Range 11/23/2018 12:42  Vitamin D, 25-Hydroxy Latest Ref Range: 30.0 - 100.0 ng/mL 32.2     OBESITY BEHAVIORAL INTERVENTION VISIT  Today's visit was # 2   Starting weight: 179 lbs Starting date: 11/23/2018 Today's weight : 177 lbs Today's date: 12/07/2018 Total lbs lost to date: 2    12/07/2018  Height  (1.626 m)  Weight 177 lb (80.3 kg)  BMI (Calculated) 30.37  BLOOD PRESSURE - SYSTOLIC 111  BLOOD PRESSURE - DIASTOLIC 78   Body Fat % 38.1 %  Total Body Water (lbs) 71.8 lbs   ASK: We discussed the diagnosis of obesity with Christina Cox today and Christina Cox agreed to give [place] permission to discuss obesity behavioral modification therapy today.  ASSESS: Christina Cox has the diagnosis of obesity and her BMI today is 30.37 Christina Cox is in the action stage of change   ADVISE: Christina Cox was educated on the multiple health risks of obesity as well as the benefit of weight loss to improve her health. She was advised of the need for long term treatment and the importance of lifestyle modifications to improve her current health and to decrease her risk of future health problems.  AGREE: Multiple dietary modification options and treatment options were discussed and  Christina Cox agreed to follow the recommendations documented in the above note.  ARRANGE: Christina Cox was educated on the importance of frequent visits to treat obesity as outlined per CMS and USPSTF guidelines and agreed to schedule her next follow up appointment today.  Cristi Loron, am acting as Energy manager for [employer]. Manson Passey, DO   I have reviewed the above documentation for accuracy and completeness, and I agree with the above. -Corinna Capra, DO

## 2018-12-22 ENCOUNTER — Ambulatory Visit (INDEPENDENT_AMBULATORY_CARE_PROVIDER_SITE_OTHER): Payer: Medicaid Other | Admitting: Family Medicine

## 2018-12-26 ENCOUNTER — Ambulatory Visit (INDEPENDENT_AMBULATORY_CARE_PROVIDER_SITE_OTHER): Payer: Medicaid Other | Admitting: Bariatrics

## 2018-12-26 NOTE — Progress Notes (Signed)
Office: [phone]  /  Fax: [phone]    Date: 12/28/2018   Time Seen: 9:35am Duration: 37 minutes Provider: Lawerance Cruel, Psy.D. Type of Session: Individual Therapy  Type of Contact: Face-to-face  Session Content: Christina Cox is a 38 y.o. female presenting for a follow-up appointment to address the previously established treatment goal of decreasing emotional eating. The session was initiated with the administration of the PHQ-9 and GAD-7, as well as a brief check-in. Christina Cox expressed concern related to the coronavirus; therefore, associated thoughts and feelings were processed. Due to uncertainty related to the coronavirus, this provider and Christina Cox discussed using emergency resources shared during the first appointment should this provider's office be closed. Christina Cox shared she still has the handout for emergency resources, and was agreeable to using the resources if needed. Currently, Christina Cox reportedly has another therapist, Christina Cox; however, she indicated she has not seen her since the last appointment with this provider. She reported a plan to call and schedule an appointment with Christina Cox. At this time, she indicated she is unsure if Christina Cox provides teletherapy services should there be a need due to the coronavirus. As such, she was receptive to this provider sending her a MyChart message with referral options. Notably, Christina Cox indicated she will be terminating services with this provider, as this provider is not credentialed with Medicaid. Nevertheless, she requested she be informed once this provider is credentialed in order to reinitiate services to focus on emotional eating.  This provider reflected the discrepancy in Christina Cox's self-report during the initial appointment and chart review. Christina Cox noted, "I don't remember. If it is there, it must have happened." She denied experiencing hallucinations and suicidal ideation since her hospitalization in 2019. When asked further about suicidal  ideation, Christina Cox denied experiencing plan and intent when experiencing suicidal ideation in January of 2019 that resulted in her hospitalization. She also denied prior suicidal ideation history; however, there was note of two suicide attempts by cutting per chart review. When this provider attempted to further explore her history of suicidal ideation in January of 2019 and prior attempts, she noted, "I can't recall that," but again denied current suicidal ideation, plan, and intent. She indicated she could also not recall details related to hallucinations she endorsed per chart review in January of 2019.   Based on Christina Cox's history, a patient safety plan was completed. The plan included the following information: warning signs that a crisis may be developing; internal coping strategies (e.g., physical activity or a relaxation technique); people and social settings that provide distraction; people to ask for help; professional and/or agencies to contact during a crisis; ways to make the environment safe; and the most important thing worth living for. Phone numbers were noted, including the number for the Suicide Prevention Lifeline. Of note, when asked to identify numbers for her brother and husband, she reported, "I would rather keep it private." The following information was noted on Christina Cox 's safety plan:  Step 1: Warning signs (thoughts, images, mood, situation, behavior) that a crisis may be developing: 1. Feeling uneased 2. Stress 3. Being tired  Step 2: Internal coping strategies- Things I can do to take my mind off my problems without contacting another person (relaxation technique, physical activity): 1. Breathing 2. Time at the park 3. Time with dogs 4. Shopping 5. Reading 6. Postcast [sic] [podcast]  Step 3: People and social settings that provide distraction: 1. Name: Husband       Phone: [did not wish to share] 2.   Name: Brother  Phone: [did not wish to share] 3.   Place:  son's room  Step 4: People whom I can ask for help: 1. Name: Husband       Phone: [did not wish to share] 2.   Name: Brother       Phone: [did not wish to share]  Step 5: Professionals or agencies I can contact during a crisis Christina Cox was provided with a handout with emergency resources at the initial appointment; therefore, she noted on the safety plan to refer to the handout.]  Step 6: Making the environment safe: 1. Already no access to firearms and no pills  Protective factors were identified under the section of the safety plan indicating  "The one thing that is most important to me and worth living for is." The following protective factors were noted: children (family) and myself. She also verbally indicated, "There are multiple things I want to live for."   Christina Cox was provided with the original copy of the safety plan, and a copy was retained to be included in her records. This provider explained that once she left this office with the safety plan, this provider could not ensure confidentiality; therefore, this provider encouraged Christina Cox to place her safety plan in a safe, yet accessible place. She acknowledged understanding, and agreed. Christina Cox's confidence in utilizing emergency resources should she experience suicidal ideation was assessed on a scale of one to ten where one is not confident and ten is extremely confident. She reported her confidence is a 10. Christina Cox added, "I've done it before, I have no problem reaching out for help again."   Regarding eating, Christina Cox endorsed episodes of emotional eating secondary to stress. Psychoeducation regarding triggers for emotional eating was provided. Christina Cox was provided a handout, and encouraged to utilize the handout to increase awareness of triggers and frequency. Christina Cox agreed. Since a future appointment with this provider will not be scheduled at this time, she was encouraged to share about her triggers for emotional eating with her therapist  to focus on increasing coping skills; Christina Cox agreed.  Moreover, psychoeducation regarding pleasurable activities, including its impact on emotional eating was provided. Christina Cox was provided with a handout with various options of pleasurable activities, and was encouraged to engage in at least one activity a day to assist with stress and subsequent emotional eating. Christina Cox agreed. Christina Cox was receptive to today's session as evidenced by openness to sharing about eating habits, responsiveness to feedback, engagement in safety planning, and willingness to identify triggers for emotional eating. Session concluded with Christina Cox stating, "Thank you for all your help."   Mental Status Examination: Christina Cox arrived on time for the appointment; however, there was a delay in the check-in process. She presented as appropriately dressed and groomed. Christina Cox appeared her stated age and demonstrated adequate orientation to time, place, person, and purpose of the appointment. She also demonstrated appropriate eye contact. No psychomotor abnormalities or behavioral peculiarities noted. Her mood was euthymic with congruent affect. Her thought processes were logical, linear, and goal-directed. No hallucinations, delusions, bizarre thinking or behavior reported or observed. Judgment, insight, and impulse control appeared to be grossly intact. Notably, she appeared to be guarded about her psychiatric history, as evidenced by her indicating she could not recall details related to her hospitalization in January of 2019. She also reported she did not want to share contact information for family nor her other therapist, Christina Cox. There was no evidence of paraphasias (i.e., errors in speech, gross mispronunciations, and word substitutions), repetition deficits, or  disturbances in volume or prosody (i.e., rhythm and intonation). There was no evidence of attention or memory impairments. Christina Cox denied current suicidal and homicidal ideation, plan  and intent.   Structured Assessment Results: The Patient Health Questionnaire-9 (PHQ-9) is a self-report measure that assesses symptoms and severity of depression over the course of the last two weeks. Christina Cox obtained a score of 1 suggesting minimal depression. Christina Cox finds the endorsed symptoms to be somewhat difficult. Depression screen Christina Cox 2/9 12/28/2018  Decreased Interest 0  Down, Depressed, Hopeless 0  PHQ - 2 Score 0  Altered sleeping 0  Tired, decreased energy 1  Change in appetite 0  Feeling bad or failure about yourself  0  Trouble concentrating 0  Moving slowly or fidgety/restless 0  Suicidal thoughts 0  PHQ-9 Score 1  Difficult doing work/chores -   The Generalized Anxiety Disorder-7 (GAD-7) is a brief self-report measure that assesses symptoms of anxiety over the course of the last two weeks. Christina Cox obtained a score of zero. GAD 7 : Generalized Anxiety Score 12/28/2018  Nervous, Anxious, on Edge 0  Control/stop worrying 0  Worry too much - different things 0  Trouble relaxing 0  Restless 0  Easily annoyed or irritable 0  Afraid - awful might happen 0  Total GAD 7 Score 0  Anxiety Difficulty -   Interventions:  Administration of PHQ-9 and GAD-7 for symptom monitoring Empathic reflections and validation Risk assessment Psychoeducation regarding triggers for emotional eating Positive reinforcement Safety planning Rapport building Brief chart review  DSM-5 Diagnosis: 311 (F32.8) Other Specified Depressive Disorder, Emotional Eating Behaviors  Treatment Goal & Progress: During the initial appointment with this provider, the following treatment goal was established: decrease emotional eating. Progress is limited, as Christina Cox has just begun treatment with this provider; however, she is receptive to the interaction and interventions and rapport is being established. Nevertheless, Christina Cox has demonstrated some progress in her goal as evidenced by her increased awareness of  hunger patterns.   Plan: Christina Cox declined future appointments with this provider, as this provider is not credentialed with Medicaid at this time. Nevertheless, she requested this provider call her to reinitiate services once this provider is credentialed. At this time, Lean noted a plan to continue therapeutic services with her other therapist, Christina Cox. Previously, Christina Cox declined to complete an authorization for coordination of care with her other therapist. During today's appointment, this provider recommended again an authorization be signed to assist with coordination of care, as it relates to emotional eating since this would be the last appointment with this provider at this time. However, Christina Cox indicated, "No that is okay. I will just tell her."

## 2018-12-28 ENCOUNTER — Encounter (INDEPENDENT_AMBULATORY_CARE_PROVIDER_SITE_OTHER): Payer: Self-pay

## 2018-12-28 ENCOUNTER — Other Ambulatory Visit: Payer: Self-pay

## 2018-12-28 ENCOUNTER — Encounter (INDEPENDENT_AMBULATORY_CARE_PROVIDER_SITE_OTHER): Payer: Self-pay | Admitting: Bariatrics

## 2018-12-28 ENCOUNTER — Ambulatory Visit (INDEPENDENT_AMBULATORY_CARE_PROVIDER_SITE_OTHER): Payer: Medicaid Other | Admitting: Psychology

## 2018-12-28 ENCOUNTER — Ambulatory Visit (INDEPENDENT_AMBULATORY_CARE_PROVIDER_SITE_OTHER): Payer: Medicaid Other | Admitting: Bariatrics

## 2018-12-28 ENCOUNTER — Other Ambulatory Visit (INDEPENDENT_AMBULATORY_CARE_PROVIDER_SITE_OTHER): Payer: Self-pay | Admitting: Bariatrics

## 2018-12-28 VITALS — BP 109/73 | HR 67 | Temp 98.2°F | Ht 64.0 in | Wt 178.0 lb

## 2018-12-28 DIAGNOSIS — F3289 Other specified depressive episodes: Secondary | ICD-10-CM

## 2018-12-28 DIAGNOSIS — Z6830 Body mass index (BMI) 30.0-30.9, adult: Secondary | ICD-10-CM | POA: Diagnosis not present

## 2018-12-28 DIAGNOSIS — E8881 Metabolic syndrome: Secondary | ICD-10-CM

## 2018-12-28 DIAGNOSIS — E669 Obesity, unspecified: Secondary | ICD-10-CM

## 2018-12-28 MED ORDER — METFORMIN HCL 500 MG PO TABS: 500.0000 mg | ORAL_TABLET | Freq: Every day | ORAL | 0 refills | Status: DC

## 2018-12-28 NOTE — Progress Notes (Signed)
Office: [phone]  /  Fax: [phone]   HPI:   Chief Complaint: OBESITY [name] is here to discuss her progress with her obesity treatment plan. She is on the Category 2 plan +100 calories and is following her eating plan approximately 80 % of the time. She states she is exercising 0 minutes 0 times per week. Christina Cox is feeling hungry around lunch time. Christina Cox is getting her protein intake. Her weight is 178 lb (80.7 kg) today and has had a weight gain of 1 pound over a period of 3 weeks since her last visit. She has lost 1 lb since starting treatment with [place].  Insulin Resistance Christina Cox has a diagnosis of insulin resistance based on her elevated fasting insulin level >5. Although Christina Cox's blood glucose readings are still under good control, insulin resistance puts her at greater risk of metabolic syndrome and diabetes. Her last A1c was at 5.1 and last insulin level was at 13.6 Christina Cox is not on medications currently and she continues to work on diet and exercise to decrease risk of diabetes.  Depression with emotional eating behaviors Christina Cox is struggling with emotional eating and using food for comfort to the extent that it is negatively impacting her health. She often snacks when she is not hungry. Christina Cox sometimes feels she is out of control and then feels guilty that she made poor food choices. Christina Cox is taking Clonazepam, Lamictal and Latuda. She has seen Dr. Dewaine Conger our bariatric psychologist. She has been working on behavior modification techniques to help reduce her emotional eating and has been somewhat successful. She shows no sign of suicidal or homicidal ideations.  Depression screen Christina Cox 2/9 12/28/2018 12/08/2018 11/23/2018  Decreased Interest 0 1 3  Down, Depressed, Hopeless 0 1 3  PHQ - 2 Score 0 2 6  Altered sleeping 0 1 2  Tired, decreased energy 1 1 3   Change in appetite 0 2 2  Feeling bad or failure about yourself  0 1 3  Trouble concentrating 0 1 1  Moving slowly or  fidgety/restless 0 1 0  Suicidal thoughts 0 0 0  PHQ-9 Score 1 9 17   Difficult doing work/chores - - Very difficult    ASSESSMENT AND PLAN:  Insulin resistance - Plan: metFORMIN (GLUCOPHAGE) 500 MG tablet  Other depression - with emotional eating  Class 1 obesity with serious comorbidity and body mass index (BMI) of 30.0 to 30.9 in adult, unspecified obesity type  PLAN:  Insulin Resistance Christina Cox will continue to work on weight loss, exercise, and decreasing simple carbohydrates in her diet to help decrease the risk of diabetes. We dicussed metformin including benefits and risks. She was informed that eating too many simple carbohydrates or too many calories at one sitting increases the likelihood of GI side effects. Christina Cox agreed to start metformin 500 mg once daily #30 with no refills and follow up with [place] as directed to monitor her progress.  Depression with Emotional Eating Behaviors We discussed behavior modification techniques today to help Christina Cox deal with her emotional eating and depression. She will continue with her personal counselor and Dr. Dewaine Conger will send list for telecom.  Obesity Christina Cox is currently in the action stage of change. As such, her goal is to continue with weight loss efforts She has agreed to follow the Category 2 plan +100 calories Christina Cox has been instructed to work up to a goal of 150 minutes of combined cardio and strengthening exercise per week for weight loss and overall health benefits. We discussed  the following Behavioral Modification Strategies today: increase H2O intake, no skipping meals, keeping healthy foods in the home, increasing lean protein intake, decreasing simple carbohydrates, increasing vegetables, decrease eating out and work on meal planning and easy cooking plans  Christina Cox has agreed to follow up with our clinic in 2 weeks. She was informed of the importance of frequent follow up visits to maximize her success with intensive lifestyle  modifications for her multiple health conditions.  ALLERGIES: No Known Allergies  MEDICATIONS: Current Outpatient Medications on File Prior to Visit  Medication Sig Dispense Refill  . clonazePAM (KLONOPIN) 0.5 MG tablet Take 0.5 mg by mouth 2 (two) times daily as needed for anxiety.    . lamoTRIgine (LAMICTAL) 100 MG tablet Take 1 tablet (100 mg total) by mouth every evening. 30 tablet 0  . lurasidone (LATUDA) 20 MG TABS tablet Take 1 tablet (20 mg total) by mouth daily with supper. For mood control 30 tablet 0  . topiramate (TOPAMAX) 100 MG tablet Take 1 tablet (100 mg total) by mouth 2 (two) times daily. 30 tablet 0  . Vitamin D, Ergocalciferol, (DRISDOL) 1.25 MG (50000 UT) CAPS capsule Take 1 capsule (50,000 Units total) by mouth every 7 (seven) days. 4 capsule 0   No current facility-administered medications on file prior to visit.     PAST MEDICAL HISTORY: Past Medical History:  Diagnosis Date  . Anxiety   . Back pain   . Chest pain   . Chronic migraine   . Constipation   . Depression   . Depression   . Headache   . IBS (irritable bowel syndrome)   . Interstitial cystitis   . Knee pain   . Shortness of breath   . Stiff neck   . Swelling of left lower extremity   . Swelling of lower extremity   . Trouble in sleeping   . Wears glasses     PAST SURGICAL HISTORY: Past Surgical History:  Procedure Laterality Date  . BREAST ENHANCEMENT SURGERY Bilateral 2012  . BREAST SURGERY     implants  . BUNIONECTOMY Right age 57  . BUNIONECTOMY    . CESAREAN SECTION N/A 12/26/2016   Procedure: CESAREAN SECTION;  Surgeon: Philip Aspen, DO;  Location: [facility] BIRTHING SUITES;  Service: Obstetrics;  Laterality: N/A;  . CYSTO WITH HYDRODISTENSION N/A 10/13/2013   Procedure: CYSTOSCOPY/HYDRODISTENSION;  Surgeon: Crist Fat, MD;  Location: [facility];  Service: Urology;  Laterality: N/A;  . INTRAUTERINE DEVICE (IUD) INSERTION  2013   mirena  . WISDOM TOOTH EXTRACTION  age 59     SOCIAL HISTORY: Social History   Tobacco Use  . Smoking status: Former Games developer  . Smokeless tobacco: Never Used  Substance Use Topics  . Alcohol use: No  . Drug use: No    FAMILY HISTORY: Family History  Problem Relation Age of Onset  . Hypertension Father   . Heart attack Father   . Hyperlipidemia Father   . Heart disease Father   . Stroke Father   . Migraines Mother   . Alcohol abuse Mother   . Depression Mother   . Mental illness Mother   . Cancer Mother   . Depression Maternal Aunt   . Depression Maternal Grandmother   . Cancer Maternal Grandmother        lung cancer-smoker    ROS: Review of Systems  Constitutional: Negative for weight loss.  Psychiatric/Behavioral: Positive for depression. Negative for suicidal ideas.    PHYSICAL EXAM: Blood pressure 109/73,  pulse 67, temperature 98.2 F (36.8 C), temperature source Oral, height 5\' 4"  (1.626 m), weight 178 lb (80.7 kg), last menstrual period 12/02/2018, SpO2 96 %, unknown if currently breastfeeding. Body mass index is 30.55 kg/m. Physical Exam Vitals signs reviewed.  Constitutional:      Appearance: Normal appearance. She is well-developed. She is obese.  Cardiovascular:     Rate and Rhythm: Normal rate.  Pulmonary:     Effort: Pulmonary effort is normal.  Musculoskeletal: Normal range of motion.  Skin:    General: Skin is warm and dry.  Neurological:     Mental Status: She is alert and oriented to person, place, and time.  Psychiatric:        Mood and Affect: Mood normal.        Behavior: Behavior normal.        Thought Content: Thought content does not include homicidal or suicidal ideation.     RECENT LABS AND TESTS: BMET    Component Value Date/Time   NA 140 11/23/2018 1242   K 4.5 11/23/2018 1242   CL 102 11/23/2018 1242   CO2 22 11/23/2018 1242   GLUCOSE 63 (L) 11/23/2018 1242   GLUCOSE 122 (H) 10/21/2017 0739   BUN 9 11/23/2018 1242   CREATININE 0.92 11/23/2018 1242   CALCIUM 8.9  11/23/2018 1242   GFRNONAA 79 11/23/2018 1242   GFRAA 91 11/23/2018 1242   Lab Results  Component Value Date   HGBA1C 5.1 11/23/2018   HGBA1C 5.1 10/21/2017   Lab Results  Component Value Date   INSULIN 13.6 11/23/2018   CBC    Component Value Date/Time   WBC 8.4 11/23/2018 1242   WBC 8.6 10/21/2017 0739   RBC 5.17 11/23/2018 1242   RBC 5.13 (H) 10/21/2017 0739   HGB 15.3 11/23/2018 1242   HCT 45.3 11/23/2018 1242   PLT 393 10/21/2017 0739   MCV 88 11/23/2018 1242   MCH 29.6 11/23/2018 1242   MCH 27.3 10/21/2017 0739   MCHC 33.8 11/23/2018 1242   MCHC 32.0 10/21/2017 0739   RDW 12.1 11/23/2018 1242   LYMPHSABS 2.6 11/23/2018 1242   MONOABS 0.9 07/09/2008 1238   EOSABS 0.1 11/23/2018 1242   BASOSABS 0.0 11/23/2018 1242   Iron/TIBC/Ferritin/ %Sat No results found for: IRON, TIBC, FERRITIN, IRONPCTSAT Lipid Panel     Component Value Date/Time   CHOL 161 11/23/2018 1242   TRIG 98 11/23/2018 1242   HDL 52 11/23/2018 1242   CHOLHDL 4.5 10/21/2017 0739   VLDL 40 10/21/2017 0739   LDLCALC 89 11/23/2018 1242   Hepatic Function Panel     Component Value Date/Time   PROT 7.0 11/23/2018 1242   ALBUMIN 4.6 11/23/2018 1242   AST 17 11/23/2018 1242   ALT 14 11/23/2018 1242   ALKPHOS 114 11/23/2018 1242   BILITOT <0.2 11/23/2018 1242      Component Value Date/Time   TSH 0.974 11/23/2018 1242   TSH <0.010 (L) 10/21/2017 0739   Results for Francene BoyersWHITESELL, Daira (MRN [id number]) as of 12/28/2018 14:31  Ref. Range 11/23/2018 12:42  Vitamin D, 25-Hydroxy Latest Ref Range: 30.0 - 100.0 ng/mL 32.2    OBESITY BEHAVIORAL INTERVENTION VISIT  Today's visit was # 3   Starting weight: 179 lbs Starting date: 11/23/2018 Today's weight : 178 lbs Today's date: 12/28/2018 Total lbs lost to date: 1    12/28/2018  Height 5\' 4"  (1.626 m)  Weight 178 lb (80.7 kg)  BMI (Calculated) 30.54  BLOOD PRESSURE - SYSTOLIC  109  BLOOD PRESSURE - DIASTOLIC 73   Body Fat % 38.4 %  Total Body  Water (lbs) 71.6 lbs    ASK: We discussed the diagnosis of obesity with Francene Boyers today and Barbara agreed to give [place] permission to discuss obesity behavioral modification therapy today.  ASSESS: Neidy has the diagnosis of obesity and her BMI today is 30.54 Avian is in the action stage of change   ADVISE: Dorethea was educated on the multiple health risks of obesity as well as the benefit of weight loss to improve her health. She was advised of the need for long term treatment and the importance of lifestyle modifications to improve her current health and to decrease her risk of future health problems.  AGREE: Multiple dietary modification options and treatment options were discussed and  Rheanne agreed to follow the recommendations documented in the above note.  ARRANGE: Mala was educated on the importance of frequent visits to treat obesity as outlined per CMS and USPSTF guidelines and agreed to schedule her next follow up appointment today.  Cristi Loron, am acting as Energy manager for [employer]. Manson Passey, DO  I have reviewed the above documentation for accuracy and completeness, and I agree with the above. -Corinna Capra, DO

## 2018-12-29 ENCOUNTER — Ambulatory Visit (INDEPENDENT_AMBULATORY_CARE_PROVIDER_SITE_OTHER): Payer: Self-pay | Admitting: Bariatrics

## 2018-12-29 ENCOUNTER — Encounter (INDEPENDENT_AMBULATORY_CARE_PROVIDER_SITE_OTHER): Payer: Self-pay

## 2018-12-29 DIAGNOSIS — Z6830 Body mass index (BMI) 30.0-30.9, adult: Secondary | ICD-10-CM

## 2018-12-29 DIAGNOSIS — E669 Obesity, unspecified: Secondary | ICD-10-CM | POA: Insufficient documentation

## 2018-12-31 ENCOUNTER — Other Ambulatory Visit (INDEPENDENT_AMBULATORY_CARE_PROVIDER_SITE_OTHER): Payer: Self-pay | Admitting: Bariatrics

## 2018-12-31 DIAGNOSIS — E559 Vitamin D deficiency, unspecified: Secondary | ICD-10-CM

## 2019-01-03 ENCOUNTER — Encounter (INDEPENDENT_AMBULATORY_CARE_PROVIDER_SITE_OTHER): Payer: Self-pay

## 2019-01-10 ENCOUNTER — Encounter (INDEPENDENT_AMBULATORY_CARE_PROVIDER_SITE_OTHER): Payer: Self-pay

## 2019-01-16 ENCOUNTER — Other Ambulatory Visit (INDEPENDENT_AMBULATORY_CARE_PROVIDER_SITE_OTHER): Payer: Self-pay | Admitting: Bariatrics

## 2019-01-16 ENCOUNTER — Ambulatory Visit (INDEPENDENT_AMBULATORY_CARE_PROVIDER_SITE_OTHER): Payer: Medicare Other | Admitting: Bariatrics

## 2019-01-16 ENCOUNTER — Other Ambulatory Visit: Payer: Self-pay

## 2019-01-16 ENCOUNTER — Encounter (INDEPENDENT_AMBULATORY_CARE_PROVIDER_SITE_OTHER): Payer: Self-pay | Admitting: Bariatrics

## 2019-01-16 DIAGNOSIS — E8881 Metabolic syndrome: Secondary | ICD-10-CM

## 2019-01-16 DIAGNOSIS — E559 Vitamin D deficiency, unspecified: Secondary | ICD-10-CM | POA: Diagnosis not present

## 2019-01-16 DIAGNOSIS — Z6830 Body mass index (BMI) 30.0-30.9, adult: Secondary | ICD-10-CM | POA: Diagnosis not present

## 2019-01-16 DIAGNOSIS — E669 Obesity, unspecified: Secondary | ICD-10-CM

## 2019-01-16 MED ORDER — METFORMIN HCL 500 MG PO TABS: 500.0000 mg | ORAL_TABLET | Freq: Every day | ORAL | 0 refills | Status: DC

## 2019-01-16 MED ORDER — VITAMIN D (ERGOCALCIFEROL) 1.25 MG (50000 UNIT) PO CAPS: ORAL_CAPSULE | ORAL | 0 refills | Status: DC

## 2019-01-16 NOTE — Progress Notes (Signed)
Office: [phone]  /  Fax: [phone] TeleHealth Visit:  Christina Cox has verbally consented to this TeleHealth visit today. The patient is located in her parking lot, the provider is located at the [facility] office. The participants in this visit include the listed provider and patient and any and all parties involved. The visit was conducted today via FaceTime.  HPI:   Chief Complaint: OBESITY Christina Cox is here to discuss her progress with her obesity treatment plan. She is on the Category 2 plan +100 calories and is following her eating plan approximately 65 % of the time. She states she is exercising 0 minutes 0 times per week. Christina Cox thinks that her weight is the same. Her routine had been disrupted. She denies any stress eating. We were unable to weigh the patient today for this TeleHealth visit. She feels as if she has maintained weight since her last visit. She has lost 1 lb since starting treatment with [place].  Insulin Resistance Christina Cox has a diagnosis of insulin resistance based on her elevated fasting insulin level >5. Although Su's blood glucose readings are still under good control, insulin resistance puts her at greater risk of metabolic syndrome and diabetes. She is taking metformin currently and continues to work on diet and exercise to decrease risk of diabetes. Christina Cox denies polyphagia.  Vitamin D deficiency Christina Cox has a diagnosis of vitamin D deficiency. She is currently taking vit D and denies nausea, vomiting or muscle weakness.  ASSESSMENT AND PLAN:  Insulin resistance - Plan: metFORMIN (GLUCOPHAGE) 500 MG tablet  Vitamin D deficiency - Plan: Vitamin D, Ergocalciferol, (DRISDOL) 1.25 MG (50000 UT) CAPS capsule  Class 1 obesity with serious comorbidity and body mass index (BMI) of 30.0 to 30.9 in adult, unspecified obesity type  PLAN:  Insulin Resistance Christina Cox will continue to work on weight loss, exercise, and decreasing simple  carbohydrates in her diet to help decrease the risk of diabetes. We dicussed metformin including benefits and risks. She was informed that eating too many simple carbohydrates or too many calories at one sitting increases the likelihood of GI side effects. Christina Cox agreed to continue metformin 500 mg once daily with breakfast #30 with no refills and follow up with [place] as directed to monitor her progress.  Vitamin D Deficiency Christina Cox was informed that low vitamin D levels contributes to fatigue and are associated with obesity, breast, and colon cancer. She agrees to continue to take prescription Vit D ,000 IU every week #4 with no refills and will follow up for routine testing of vitamin D, at least 2-3 times per year. She was informed of the risk of over-replacement of vitamin D and agrees to not increase her dose unless she discusses this with [place] first. Christina Cox agrees to follow up with our clinic in 2 weeks.  Obesity Christina Cox is currently in the action stage of change. As such, her goal is to continue with weight loss efforts She has agreed to follow the Category 2 plan +100 calories Christina Cox will begin walking and she will do YouTube video exercise for weight loss and overall health benefits. We discussed the following Behavioral Modification Strategies today: increase H2O intake, no skipping meals, keeping healthy foods in the home, increasing lean protein intake, decreasing simple carbohydrates, increasing vegetables, decrease eating out and work on meal planning and easy cooking plans Christina Cox will weigh herself at home.  [name] has agreed to follow up with our clinic in 2 weeks. She was informed of the importance of  frequent follow up visits to maximize her success with intensive lifestyle modifications for her multiple health conditions.  ALLERGIES: No Known Allergies  MEDICATIONS: Current Outpatient Medications on File Prior to Visit  Medication Sig Dispense Refill  . clonazePAM (KLONOPIN) 0.5  MG tablet Take 0.5 mg by mouth 2 (two) times daily as needed for anxiety.    . lamoTRIgine (LAMICTAL) 100 MG tablet Take 1 tablet (100 mg total) by mouth every evening. 30 tablet 0  . lurasidone (LATUDA) 20 MG TABS tablet Take 1 tablet (20 mg total) by mouth daily with supper. For mood control 30 tablet 0  . topiramate (TOPAMAX) 100 MG tablet Take 1 tablet (100 mg total) by mouth 2 (two) times daily. 30 tablet 0   No current facility-administered medications on file prior to visit.     PAST MEDICAL HISTORY: Past Medical History:  Diagnosis Date  . Anxiety   . Back pain   . Chest pain   . Chronic migraine   . Constipation   . Depression   . Depression   . Headache   . IBS (irritable bowel syndrome)   . Interstitial cystitis   . Knee pain   . Shortness of breath   . Stiff neck   . Swelling of left lower extremity   . Swelling of lower extremity   . Trouble in sleeping   . Wears glasses     PAST SURGICAL HISTORY: Past Surgical History:  Procedure Laterality Date  . BREAST ENHANCEMENT SURGERY Bilateral 2012  . BREAST SURGERY     implants  . BUNIONECTOMY Right age 5  . BUNIONECTOMY    . CESAREAN SECTION N/A 12/26/2016   Procedure: CESAREAN SECTION;  Surgeon: Philip Aspen, DO;  Location: [facility] BIRTHING SUITES;  Service: Obstetrics;  Laterality: N/A;  . CYSTO WITH HYDRODISTENSION N/A 10/13/2013   Procedure: CYSTOSCOPY/HYDRODISTENSION;  Surgeon: Crist Fat, MD;  Location: [facility];  Service: Urology;  Laterality: N/A;  . INTRAUTERINE DEVICE (IUD) INSERTION  2013   mirena  . WISDOM TOOTH EXTRACTION  age 45    SOCIAL HISTORY: Social History   Tobacco Use  . Smoking status: Former Games developer  . Smokeless tobacco: Never Used  Substance Use Topics  . Alcohol use: No  . Drug use: No    FAMILY HISTORY: Family History  Problem Relation Age of Onset  . Hypertension Father   . Heart attack Father   . Hyperlipidemia Father   . Heart disease Father   . Stroke Father   .  Migraines Mother   . Alcohol abuse Mother   . Depression Mother   . Mental illness Mother   . Cancer Mother   . Depression Maternal Aunt   . Depression Maternal Grandmother   . Cancer Maternal Grandmother        lung cancer-smoker    ROS: Review of Systems  Constitutional: Negative for weight loss.  Gastrointestinal: Negative for nausea and vomiting.  Musculoskeletal:       Negative for muscle weakness  Endo/Heme/Allergies:       Negative for polyphagia    PHYSICAL EXAM: Pt in no acute distress  RECENT LABS AND TESTS: BMET    Component Value Date/Time   NA 140 11/23/2018 1242   K 4.5 11/23/2018 1242   CL 102 11/23/2018 1242   CO2 22 11/23/2018 1242   GLUCOSE 63 (L) 11/23/2018 1242   GLUCOSE 122 (H) 10/21/2017 0739   BUN 9 11/23/2018 1242   CREATININE 0.92 11/23/2018 1242  CALCIUM 8.9 11/23/2018 1242   GFRNONAA 79 11/23/2018 1242   GFRAA 91 11/23/2018 1242   Lab Results  Component Value Date   HGBA1C 5.1 11/23/2018   HGBA1C 5.1 10/21/2017   Lab Results  Component Value Date   INSULIN 13.6 11/23/2018   CBC    Component Value Date/Time   WBC 8.4 11/23/2018 1242   WBC 8.6 10/21/2017 0739   RBC 5.17 11/23/2018 1242   RBC 5.13 (H) 10/21/2017 0739   HGB 15.3 11/23/2018 1242   HCT 45.3 11/23/2018 1242   PLT 393 10/21/2017 0739   MCV 88 11/23/2018 1242   MCH 29.6 11/23/2018 1242   MCH 27.3 10/21/2017 0739   MCHC 33.8 11/23/2018 1242   MCHC 32.0 10/21/2017 0739   RDW 12.1 11/23/2018 1242   LYMPHSABS 2.6 11/23/2018 1242   MONOABS 0.9 07/09/2008 1238   EOSABS 0.1 11/23/2018 1242   BASOSABS 0.0 11/23/2018 1242   Iron/TIBC/Ferritin/ %Sat No results found for: IRON, TIBC, FERRITIN, IRONPCTSAT Lipid Panel     Component Value Date/Time   CHOL 161 11/23/2018 1242   TRIG 98 11/23/2018 1242   HDL 52 11/23/2018 1242   CHOLHDL 4.5 10/21/2017 0739   VLDL 40 10/21/2017 0739   LDLCALC 89 11/23/2018 1242   Hepatic Function Panel     Component Value Date/Time    PROT 7.0 11/23/2018 1242   ALBUMIN 4.6 11/23/2018 1242   AST 17 11/23/2018 1242   ALT 14 11/23/2018 1242   ALKPHOS 114 11/23/2018 1242   BILITOT <0.2 11/23/2018 1242      Component Value Date/Time   TSH 0.974 11/23/2018 1242   TSH <0.010 (L) 10/21/2017 0739    Results for Francene BoyersWHITESELL, Kendi (MRN [id number]) as of 01/16/2019 16:30  Ref. Range 11/23/2018 12:42  Vitamin D, 25-Hydroxy Latest Ref Range: 30.0 - 100.0 ng/mL 32.2    I, [place] CraneJoanne Murray, am acting as Energy managertranscriptionist for [employer]. Manson PasseyBrown, DO  I have reviewed the above documentation for accuracy and completeness, and I agree with the above. -Corinna CapraAngel Yusuke [name], DO

## 2019-01-19 ENCOUNTER — Other Ambulatory Visit (INDEPENDENT_AMBULATORY_CARE_PROVIDER_SITE_OTHER): Payer: Self-pay | Admitting: Bariatrics

## 2019-01-19 DIAGNOSIS — E8881 Metabolic syndrome: Secondary | ICD-10-CM

## 2019-01-24 DIAGNOSIS — F315 Bipolar disorder, current episode depressed, severe, with psychotic features: Secondary | ICD-10-CM | POA: Diagnosis not present

## 2019-01-24 DIAGNOSIS — Z6830 Body mass index (BMI) 30.0-30.9, adult: Secondary | ICD-10-CM | POA: Diagnosis not present

## 2019-01-30 ENCOUNTER — Ambulatory Visit (INDEPENDENT_AMBULATORY_CARE_PROVIDER_SITE_OTHER): Payer: Self-pay | Admitting: Bariatrics

## 2019-02-06 ENCOUNTER — Encounter (INDEPENDENT_AMBULATORY_CARE_PROVIDER_SITE_OTHER): Payer: Self-pay | Admitting: Bariatrics

## 2019-02-06 ENCOUNTER — Other Ambulatory Visit: Payer: Self-pay

## 2019-02-06 ENCOUNTER — Ambulatory Visit (INDEPENDENT_AMBULATORY_CARE_PROVIDER_SITE_OTHER): Payer: Medicare Other | Admitting: Bariatrics

## 2019-02-06 DIAGNOSIS — E8881 Metabolic syndrome: Secondary | ICD-10-CM | POA: Diagnosis not present

## 2019-02-06 DIAGNOSIS — E669 Obesity, unspecified: Secondary | ICD-10-CM | POA: Diagnosis not present

## 2019-02-06 DIAGNOSIS — E559 Vitamin D deficiency, unspecified: Secondary | ICD-10-CM | POA: Diagnosis not present

## 2019-02-06 DIAGNOSIS — Z6830 Body mass index (BMI) 30.0-30.9, adult: Secondary | ICD-10-CM | POA: Diagnosis not present

## 2019-02-06 MED ORDER — VITAMIN D (ERGOCALCIFEROL) 1.25 MG (50000 UNIT) PO CAPS: ORAL_CAPSULE | ORAL | 0 refills | Status: DC

## 2019-02-06 MED ORDER — METFORMIN HCL 500 MG PO TABS: 500.0000 mg | ORAL_TABLET | Freq: Every day | ORAL | 0 refills | Status: DC

## 2019-02-07 NOTE — Progress Notes (Signed)
Office: [phone]  /  Fax: [phone] TeleHealth Visit:  Christina Cox has verbally consented to this TeleHealth visit today. The patient is located at home, the provider is located at the [facility] office. The participants in this visit include the listed provider and patient and any and all parties involved. The visit was conducted today via FaceTime.  HPI:   Chief Complaint: OBESITY Christina Cox is here to discuss her progress with her obesity treatment plan. She is on the Category 2 plan +100 calories and is following her eating plan approximately 55 to 60 % of the time. She states she is exercising 0 minutes 0 times per week. Jaislyn has gained 4 pounds (weight 189 lbs). She has been eating more. She is cooking more. Keirstan is stress eating more during the day. We were unable to weigh the patient today for this TeleHealth visit. She feels as if she has gained weight since her last visit. She has gained 10 lbs since starting treatment with [place].  Vitamin D deficiency Christina Cox has a diagnosis of vitamin D deficiency. She is taking high dose vit D and denies nausea, vomiting or muscle weakness.  Insulin Resistance Christina Cox has a diagnosis of insulin resistance based on her elevated fasting insulin level >5. Although Christina Cox's blood glucose readings are still under good control, insulin resistance puts her at greater risk of metabolic syndrome and diabetes. She is taking metformin without side effects and she continues to work on diet and exercise to decrease risk of diabetes. Christina Cox denies polyphagia.  ASSESSMENT AND PLAN:  Vitamin D deficiency - Plan: Vitamin D, Ergocalciferol, (DRISDOL) 1.25 MG (50000 UT) CAPS capsule  Insulin resistance - Plan: metFORMIN (GLUCOPHAGE) 500 MG tablet  Class 1 obesity with serious comorbidity and body mass index (BMI) of 30.0 to 30.9 in adult, unspecified obesity type  PLAN:  Vitamin D Deficiency Christina Cox was informed that low vitamin D  levels contributes to fatigue and are associated with obesity, breast, and colon cancer. She agrees to continue to take prescription Vit D @50 ,000 IU every week #4 with no refills and will follow up for routine testing of vitamin D, at least 2-3 times per year. She was informed of the risk of over-replacement of vitamin D and agrees to not increase her dose unless she discusses this with [place] first. Christina Cox agreed to follow up as directed.  Insulin Resistance Christina Cox will continue to work on weight loss, exercise, and decreasing simple carbohydrates in her diet to help decrease the risk of diabetes. We dicussed metformin including benefits and risks. She was informed that eating too many simple carbohydrates or too many calories at one sitting increases the likelihood of GI side effects. Christina Cox agreed to continue metformin 500 mg once daily with breakfast #30 with no refills and follow up with [place] as directed to monitor her progress.  Obesity Christina Cox is currently in the action stage of change. As such, her goal is to continue with weight loss efforts She has agreed to follow the Category 2 plan +100 calories Christina Cox has been instructed to work up to a goal of 150 minutes of combined cardio and strengthening exercise per week for weight loss and overall health benefits. We discussed the following Behavioral Modification Strategies today: increase H2O intake, no skipping meals, keeping healthy foods in the home, increasing lean protein intake, decreasing simple carbohydrates, increasing vegetables, decrease eating out and work on meal planning and easy cooking plans Nigel will weigh herself at home before each visit.  She will keep raw vegetables and "good fruit". Recipe sheet was given to patient today.  Christina Cox has agreed to follow up with our clinic in 2 weeks. She was informed of the importance of frequent follow up visits to maximize her success with intensive lifestyle modifications for her multiple health  conditions.  ALLERGIES: No Known Allergies  MEDICATIONS: Current Outpatient Medications on File Prior to Visit  Medication Sig Dispense Refill  . clonazePAM (KLONOPIN) 0.5 MG tablet Take 0.5 mg by mouth 2 (two) times daily as needed for anxiety.    . lamoTRIgine (LAMICTAL) 100 MG tablet Take 1 tablet (100 mg total) by mouth every evening. 30 tablet 0  . lurasidone (LATUDA) 20 MG TABS tablet Take 1 tablet (20 mg total) by mouth daily with supper. For mood control 30 tablet 0  . topiramate (TOPAMAX) 100 MG tablet Take 1 tablet (100 mg total) by mouth 2 (two) times daily. 30 tablet 0   No current facility-administered medications on file prior to visit.     PAST MEDICAL HISTORY: Past Medical History:  Diagnosis Date  . Anxiety   . Back pain   . Chest pain   . Chronic migraine   . Constipation   . Depression   . Depression   . Headache   . IBS (irritable bowel syndrome)   . Interstitial cystitis   . Knee pain   . Shortness of breath   . Stiff neck   . Swelling of left lower extremity   . Swelling of lower extremity   . Trouble in sleeping   . Wears glasses     PAST SURGICAL HISTORY: Past Surgical History:  Procedure Laterality Date  . BREAST ENHANCEMENT SURGERY Bilateral 2012  . BREAST SURGERY     implants  . BUNIONECTOMY Right age 38  . BUNIONECTOMY    . CESAREAN SECTION N/A 12/26/2016   Procedure: CESAREAN SECTION;  Surgeon: Philip AspenSidney Callahan, DO;  Location[facility];  Service: Obstetrics;  Laterality: N/A;  . CYSTO WITH HYDRODISTENSION N/A 10/13/2013   Procedure: CYSTOSCOPY/HYDRODISTENSION;  Surgeon: Crist FatBenjamin W Herrick, MD;  Location: [facility];  Service: Urology;  Laterality: N/A;  . INTRAUTERINE DEVICE (IUD) INSERTION  2013   mirena  . WISDOM TOOTH EXTRACTION  age 38    SOCIAL HISTORY: Social History   Tobacco Use  . Smoking status: Former Games developermoker  . Smokeless tobacco: Never Used  Substance Use Topics  . Alcohol use: No  . Drug use: No    FAMILY  HISTORY: Family History  Problem Relation Age of Onset  . Hypertension Father   . Heart attack Father   . Hyperlipidemia Father   . Heart disease Father   . Stroke Father   . Migraines Mother   . Alcohol abuse Mother   . Depression Mother   . Mental illness Mother   . Cancer Mother   . Depression Maternal Aunt   . Depression Maternal Grandmother   . Cancer Maternal Grandmother        lung cancer-smoker    ROS: Review of Systems  Constitutional: Negative for weight loss.  Gastrointestinal: Negative for diarrhea, nausea and vomiting.  Musculoskeletal:       Negative for muscle weakness  Endo/Heme/Allergies:       Negative for polyphagia    PHYSICAL EXAM: Pt in no acute distress  RECENT LABS AND TESTS: BMET    Component Value Date/Time   NA 140 11/23/2018 1242   K 4.5 11/23/2018 1242   CL  102 11/23/2018 1242   CO2 22 11/23/2018 1242   GLUCOSE 63 (L) 11/23/2018 1242   GLUCOSE 122 (H) 10/21/2017 0739   BUN 9 11/23/2018 1242   CREATININE 0.92 11/23/2018 1242   CALCIUM 8.9 11/23/2018 1242   GFRNONAA 79 11/23/2018 1242   GFRAA 91 11/23/2018 1242   Lab Results  Component Value Date   HGBA1C 5.1 11/23/2018   HGBA1C 5.1 10/21/2017   Lab Results  Component Value Date   INSULIN 13.6 11/23/2018   CBC    Component Value Date/Time   WBC 8.4 11/23/2018 1242   WBC 8.6 10/21/2017 0739   RBC 5.17 11/23/2018 1242   RBC 5.13 (H) 10/21/2017 0739   HGB 15.3 11/23/2018 1242   HCT 45.3 11/23/2018 1242   PLT 393 10/21/2017 0739   MCV 88 11/23/2018 1242   MCH 29.6 11/23/2018 1242   MCH 27.3 10/21/2017 0739   MCHC 33.8 11/23/2018 1242   MCHC 32.0 10/21/2017 0739   RDW 12.1 11/23/2018 1242   LYMPHSABS 2.6 11/23/2018 1242   MONOABS 0.9 07/09/2008 1238   EOSABS 0.1 11/23/2018 1242   BASOSABS 0.0 11/23/2018 1242   Iron/TIBC/Ferritin/ %Sat No results found for: IRON, TIBC, FERRITIN, IRONPCTSAT Lipid Panel     Component Value Date/Time   CHOL 161 11/23/2018 1242    TRIG 98 11/23/2018 1242   HDL 52 11/23/2018 1242   CHOLHDL 4.5 10/21/2017 0739   VLDL 40 10/21/2017 0739   LDLCALC 89 11/23/2018 1242   Hepatic Function Panel     Component Value Date/Time   PROT 7.0 11/23/2018 1242   ALBUMIN 4.6 11/23/2018 1242   AST 17 11/23/2018 1242   ALT 14 11/23/2018 1242   ALKPHOS 114 11/23/2018 1242   BILITOT <0.2 11/23/2018 1242      Component Value Date/Time   TSH 0.974 11/23/2018 1242   TSH <0.010 (L) 10/21/2017 0739     Ref. Range 11/23/2018 12:42  Vitamin D, 25-Hydroxy Latest Ref Range: 30.0 - 100.0 ng/mL 32.2   I, [place] Crane, am acting as Energy manager for [employer]. Manson Passey, DO  I have reviewed the above documentation for accuracy and completeness, and I agree with the above. -Corinna Capra, DO

## 2019-02-20 ENCOUNTER — Ambulatory Visit (INDEPENDENT_AMBULATORY_CARE_PROVIDER_SITE_OTHER): Payer: Self-pay | Admitting: Bariatrics

## 2019-02-21 ENCOUNTER — Ambulatory Visit (INDEPENDENT_AMBULATORY_CARE_PROVIDER_SITE_OTHER): Payer: Medicare Other | Admitting: Bariatrics

## 2019-02-21 ENCOUNTER — Encounter (INDEPENDENT_AMBULATORY_CARE_PROVIDER_SITE_OTHER): Payer: Self-pay | Admitting: Bariatrics

## 2019-02-21 ENCOUNTER — Other Ambulatory Visit: Payer: Self-pay

## 2019-02-21 ENCOUNTER — Other Ambulatory Visit (INDEPENDENT_AMBULATORY_CARE_PROVIDER_SITE_OTHER): Payer: Self-pay | Admitting: Bariatrics

## 2019-02-21 DIAGNOSIS — Z6830 Body mass index (BMI) 30.0-30.9, adult: Secondary | ICD-10-CM

## 2019-02-21 DIAGNOSIS — E669 Obesity, unspecified: Secondary | ICD-10-CM | POA: Diagnosis not present

## 2019-02-21 DIAGNOSIS — E559 Vitamin D deficiency, unspecified: Secondary | ICD-10-CM | POA: Diagnosis not present

## 2019-02-21 DIAGNOSIS — E8881 Metabolic syndrome: Secondary | ICD-10-CM

## 2019-02-21 DIAGNOSIS — E88819 Insulin resistance, unspecified: Secondary | ICD-10-CM

## 2019-02-21 MED ORDER — VITAMIN D (ERGOCALCIFEROL) 1.25 MG (50000 UNIT) PO CAPS: ORAL_CAPSULE | ORAL | 0 refills | Status: DC

## 2019-02-21 MED ORDER — METFORMIN HCL 500 MG PO TABS: 500.0000 mg | ORAL_TABLET | Freq: Two times a day (BID) | ORAL | 0 refills | Status: DC

## 2019-02-22 NOTE — Progress Notes (Signed)
Office: [phone]  /  Fax: [phone] TeleHealth Visit:  Christina Cox has verbally consented to this TeleHealth visit today. The patient is located at home, the provider is located at the [facility] office. The participants in this visit include the listed provider and patient and any and all parties involved. The visit was conducted today via FaceTime.  HPI:   Chief Complaint: OBESITY Christina Cox is here to discuss her progress with her obesity treatment plan. She is on the Category 2 plan and is following her eating plan approximately 55 to 57 % of the time. She states she is walking 20 minutes 2 times per week. Christina Cox states that she has gained 3 to 4 pounds (weight 187 lbs). "It has been hard with not being on a routine". She is drinking more water, but she is not getting enough protein. We were unable to weigh the patient today for this TeleHealth visit. She feels as if she has gained weight since her last visit. She has gained 8 lbs since starting treatment with [place].  Insulin Resistance Christina Cox has a diagnosis of insulin resistance based on her elevated fasting insulin level >5. Although Christina Cox's blood glucose readings are still under good control, insulin resistance puts her at greater risk of metabolic syndrome and diabetes. Her last A1c was at 5.1 and last insulin level was at 13.6 She is taking metformin currently and continues to work on diet and exercise to decrease risk of diabetes.  Vitamin D deficiency Christina Cox has a diagnosis of vitamin D deficiency. She is currently taking vit D and denies nausea, vomiting or muscle weakness.  ASSESSMENT AND PLAN:  Insulin resistance - Plan: metFORMIN (GLUCOPHAGE) 500 MG tablet  Vitamin D deficiency - Plan: Vitamin D, Ergocalciferol, (DRISDOL) 1.25 MG (50000 UT) CAPS capsule  Class 1 obesity with serious comorbidity and body mass index (BMI) of 30.0 to 30.9 in adult, unspecified obesity type  PLAN:  Insulin Resistance  Christina Cox will continue to work on weight loss, exercise, and decreasing simple carbohydrates in her diet to help decrease the risk of diabetes. We dicussed metformin including benefits and risks. She was informed that eating too many simple carbohydrates or too many calories at one sitting increases the likelihood of GI side effects. Christina Cox agreed to continue metformin 500 mg twice daily #60 with no refills and follow up with [place] as directed to monitor her progress.  Vitamin D Deficiency Christina Cox was informed that low vitamin D levels contributes to fatigue and are associated with obesity, breast, and colon cancer. She agrees to continue to take prescription Vit D ,000 IU every week #4 with no refills and will follow up for routine testing of vitamin D, at least 2-3 times per year. She was informed of the risk of over-replacement of vitamin D and agrees to not increase her dose unless she discusses this with [place] first. Christina Cox agrees to follow up as directed.  Obesity Christina Cox is currently in the action stage of change. As such, her goal is to continue with weight loss efforts She has agreed to follow the Category 2 plan +100 calories and increase 85 grams Christina Cox will exercise ([employer]) for weight loss and overall health benefits. We discussed the following Behavioral Modification Strategies today: planning for success, increase H2O intake, no skipping meals, keeping healthy foods in the home, better snacking choices, increasing lean protein intake, decreasing simple carbohydrates, increasing vegetables, decrease eating out, work on meal planning and easy cooking plans, emotional eating strategies, ways  to avoid boredom eating and ways to avoid night time snacking Christina Cox will weigh herself at home before each visit.  Christina Cox has agreed to follow up with our clinic in 2 weeks. She was informed of the importance of frequent follow up visits to maximize her success with intensive lifestyle modifications  for her multiple health conditions.  ALLERGIES: No Known Allergies  MEDICATIONS: Current Outpatient Medications on File Prior to Visit  Medication Sig Dispense Refill  . clonazePAM (KLONOPIN) 0.5 MG tablet Take 0.5 mg by mouth 2 (two) times daily as needed for anxiety.    . lamoTRIgine (LAMICTAL) 100 MG tablet Take 1 tablet (100 mg total) by mouth every evening. 30 tablet 0  . lurasidone (LATUDA) 20 MG TABS tablet Take 1 tablet (20 mg total) by mouth daily with supper. For mood control 30 tablet 0  . topiramate (TOPAMAX) 100 MG tablet Take 1 tablet (100 mg total) by mouth 2 (two) times daily. 30 tablet 0   No current facility-administered medications on file prior to visit.     PAST MEDICAL HISTORY: Past Medical History:  Diagnosis Date  . Anxiety   . Back pain   . Chest pain   . Chronic migraine   . Constipation   . Depression   . Depression   . Headache   . IBS (irritable bowel syndrome)   . Interstitial cystitis   . Knee pain   . Shortness of breath   . Stiff neck   . Swelling of left lower extremity   . Swelling of lower extremity   . Trouble in sleeping   . Wears glasses     PAST SURGICAL HISTORY: Past Surgical History:  Procedure Laterality Date  . BREAST ENHANCEMENT SURGERY Bilateral 2012  . BREAST SURGERY     implants  . BUNIONECTOMY Right age 41  . BUNIONECTOMY    . CESAREAN SECTION N/A 12/26/2016   Procedure: CESAREAN SECTION;  Surgeon: Philip Aspen, DO;  Location: [facility] BIRTHING SUIT[facility];  Service: Obstetrics;  Laterality: N/A;  . CYSTO WITH HYDRODISTENSION N/A 10/13/2013   Procedure: CYSTOSCOPY/HYDRODISTENSION;  Surgeon: Crist Fat, MD;  Location: [facility];  Service: Urology;  Laterality: N/A;  . INTRAUTERINE DEVICE (IUD) INSERTION  2013   mirena  . WISDOM TOOTH EXTRACTION  age 38    SOCIAL HISTORY: Social History   Tobacco Use  . Smoking status: Former Games developer  . Smokeless tobacco: Never Used  Substance Use Topics  . Alcohol use: No  . Drug  use: No    FAMILY HISTORY: Family History  Problem Relation Age of Onset  . Hypertension Father   . Heart attack Father   . Hyperlipidemia Father   . Heart disease Father   . Stroke Father   . Migraines Mother   . Alcohol abuse Mother   . Depression Mother   . Mental illness Mother   . Cancer Mother   . Depression Maternal Aunt   . Depression Maternal Grandmother   . Cancer Maternal Grandmother        lung cancer-smoker    ROS: Review of Systems  Constitutional: Negative for weight loss.  Gastrointestinal: Negative for nausea and vomiting.  Musculoskeletal:       Negative for muscle weakness    PHYSICAL EXAM: Pt in no acute distress  RECENT LABS AND TESTS: BMET    Component Value Date/Time   NA 140 11/23/2018 1242   K 4.5 11/23/2018 1242   CL 102 11/23/2018 1242   CO2 22  11/23/2018 1242   GLUCOSE 63 (L) 11/23/2018 1242   GLUCOSE 122 (H) 10/21/2017 0739   BUN 9 11/23/2018 1242   CREATININE 0.92 11/23/2018 1242   CALCIUM 8.9 11/23/2018 1242   GFRNONAA 79 11/23/2018 1242   GFRAA 91 11/23/2018 1242   Lab Results  Component Value Date   HGBA1C 5.1 11/23/2018   HGBA1C 5.1 10/21/2017   Lab Results  Component Value Date   INSULIN 13.6 11/23/2018   CBC    Component Value Date/Time   WBC 8.4 11/23/2018 1242   WBC 8.6 10/21/2017 0739   RBC 5.17 11/23/2018 1242   RBC 5.13 (H) 10/21/2017 0739   HGB 15.3 11/23/2018 1242   HCT 45.3 11/23/2018 1242   PLT 393 10/21/2017 0739   MCV 88 11/23/2018 1242   MCH 29.6 11/23/2018 1242   MCH 27.3 10/21/2017 0739   MCHC 33.8 11/23/2018 1242   MCHC 32.0 10/21/2017 0739   RDW 12.1 11/23/2018 1242   LYMPHSABS 2.6 11/23/2018 1242   MONOABS 0.9 07/09/2008 1238   EOSABS 0.1 11/23/2018 1242   BASOSABS 0.0 11/23/2018 1242   Iron/TIBC/Ferritin/ %Sat No results found for: IRON, TIBC, FERRITIN, IRONPCTSAT Lipid Panel     Component Value Date/Time   CHOL 161 11/23/2018 1242   TRIG 98 11/23/2018 1242   HDL 52 11/23/2018  1242   CHOLHDL 4.5 10/21/2017 0739   VLDL 40 10/21/2017 0739   LDLCALC 89 11/23/2018 1242   Hepatic Function Panel     Component Value Date/Time   PROT 7.0 11/23/2018 1242   ALBUMIN 4.6 11/23/2018 1242   AST 17 11/23/2018 1242   ALT 14 11/23/2018 1242   ALKPHOS 114 11/23/2018 1242   BILITOT <0.2 11/23/2018 1242      Component Value Date/Time   TSH 0.974 11/23/2018 1242   TSH <0.010 (L) 10/21/2017 0739     Ref. Range 11/23/2018 12:42  Vitamin D, 25-Hydroxy Latest Ref Range: 30.0 - 100.0 ng/mL 32.2    I, [place] CraneJoanne Murray, am acting as Energy managertranscriptionist for [employer]. Manson PasseyBrown, DO  I have reviewed the above documentation for accuracy and completeness, and I agree with the above. -Corinna CapraAngel Tayia Stonesifer, DO

## 2019-02-23 ENCOUNTER — Encounter (INDEPENDENT_AMBULATORY_CARE_PROVIDER_SITE_OTHER): Payer: Self-pay | Admitting: Bariatrics

## 2019-02-23 ENCOUNTER — Other Ambulatory Visit (INDEPENDENT_AMBULATORY_CARE_PROVIDER_SITE_OTHER): Payer: Self-pay | Admitting: Bariatrics

## 2019-02-23 DIAGNOSIS — E8881 Metabolic syndrome: Secondary | ICD-10-CM

## 2019-03-08 ENCOUNTER — Ambulatory Visit (INDEPENDENT_AMBULATORY_CARE_PROVIDER_SITE_OTHER): Payer: Medicare Other | Admitting: Bariatrics

## 2019-03-09 DIAGNOSIS — G43709 Chronic migraine without aura, not intractable, without status migrainosus: Secondary | ICD-10-CM | POA: Diagnosis not present

## 2019-03-17 ENCOUNTER — Encounter (INDEPENDENT_AMBULATORY_CARE_PROVIDER_SITE_OTHER): Payer: Self-pay

## 2019-04-20 ENCOUNTER — Other Ambulatory Visit (INDEPENDENT_AMBULATORY_CARE_PROVIDER_SITE_OTHER): Payer: Self-pay | Admitting: Bariatrics

## 2019-04-20 DIAGNOSIS — E559 Vitamin D deficiency, unspecified: Secondary | ICD-10-CM

## 2019-04-24 ENCOUNTER — Other Ambulatory Visit (INDEPENDENT_AMBULATORY_CARE_PROVIDER_SITE_OTHER): Payer: Self-pay | Admitting: Bariatrics

## 2019-04-24 DIAGNOSIS — E8881 Metabolic syndrome: Secondary | ICD-10-CM

## 2020-09-10 ENCOUNTER — Other Ambulatory Visit (HOSPITAL_COMMUNITY): Payer: Self-pay | Admitting: Urology

## 2020-09-10 DIAGNOSIS — R102 Pelvic and perineal pain: Secondary | ICD-10-CM

## 2020-09-16 ENCOUNTER — Encounter (HOSPITAL_COMMUNITY): Payer: Self-pay

## 2020-09-16 ENCOUNTER — Ambulatory Visit (HOSPITAL_COMMUNITY): Payer: Medicare Other | Attending: Urology

## 2020-09-27 ENCOUNTER — Ambulatory Visit (HOSPITAL_COMMUNITY): Payer: Medicare Other | Attending: Urology

## 2020-09-27 ENCOUNTER — Encounter (HOSPITAL_COMMUNITY): Payer: Self-pay

## 2020-10-28 ENCOUNTER — Ambulatory Visit (HOSPITAL_COMMUNITY): Admission: RE | Admit: 2020-10-28 | Payer: Medicare Other | Source: Ambulatory Visit

## 2020-11-04 ENCOUNTER — Ambulatory Visit (HOSPITAL_COMMUNITY): Payer: Medicare Other

## 2020-12-04 ENCOUNTER — Other Ambulatory Visit: Payer: Self-pay | Admitting: Surgery

## 2020-12-04 ENCOUNTER — Other Ambulatory Visit (HOSPITAL_COMMUNITY): Payer: Self-pay | Admitting: Surgery

## 2020-12-16 ENCOUNTER — Other Ambulatory Visit: Payer: Self-pay

## 2020-12-16 ENCOUNTER — Ambulatory Visit (HOSPITAL_COMMUNITY)
Admission: RE | Admit: 2020-12-16 | Discharge: 2020-12-16 | Disposition: A | Discharge status: Home / Self Care | Payer: Medicare Other | Source: Ambulatory Visit | Attending: Surgery | Admitting: Surgery

## 2021-02-03 ENCOUNTER — Encounter: Payer: Self-pay | Admitting: Skilled Nursing Facility1

## 2021-02-03 ENCOUNTER — Other Ambulatory Visit: Payer: Self-pay

## 2021-02-03 ENCOUNTER — Encounter: Payer: Medicare Other | Attending: Surgery | Admitting: Skilled Nursing Facility1

## 2021-02-03 DIAGNOSIS — E669 Obesity, unspecified: Secondary | ICD-10-CM | POA: Insufficient documentation

## 2021-02-03 DIAGNOSIS — Z6830 Body mass index (BMI) 30.0-30.9, adult: Secondary | ICD-10-CM | POA: Insufficient documentation

## 2021-02-03 NOTE — Progress Notes (Signed)
Nutrition Assessment for Bariatric Surgery Medical Nutrition Therapy Appt Start Time: 9:30am   End Time: 10:30am  Patient was seen on 02/03/2021 for Pre-Operative Nutrition Assessment. Letter of approval faxed to [facility] bariatric surgery program coordinator on 02/03/2021.   Referral stated Supervised Weight Loss (SWL) visits needed: 0    Not cleared at this time:  Pt to follow up for minimum of one more visit to assist pt with progressing through stages of change/further nutrition education. RD advised pt that this follow up visit is not mandated through insurance. Pt verbalized agreement.  Planned surgery: sleeve Pt expectation of surgery: develop healthier lifestyle Pt expectation of dietitian: nutrition education    NUTRITION ASSESSMENT   Anthropometrics  Start weight at [facility]: 210.2 lb lbs (date: 02/03/2021)  Height: 63 in BMI: 37.24 kg/m2     Clinical  Medical hx: hypertension, acid reflux, anxiety, depression Medications:  Pt reported from handwritten list -  Etodolac ER, Ozempic, topiramate, tolterodine, verapamil, vitamin D2, bupropion, omeprazole, chlorzoxaxine Labs: HgA1c 5.1 (2020) Notable signs/symptoms: no Any previous deficiencies? No  Micronutrient Nutrition Focused Physical Exam: Hair: No issues observed Eyes: No issues observed Mouth: No issues observed Neck: No issues observed Nails: No issues observed Skin: No issues observed  Lifestyle & Dietary Hx Pt reports feeling hungry, moody, hot when she skips meals.  This started about 1 year ago and she has tested her blood sugar at home and got "normal" results. Pt reports not eating yet this morning and feeling onset of these symptoms.  We tested in office and it was 92mg /dL.  She was provided a snack.  Pt reports she likes to cook and eat vegetables (she loves cucumber).  She requests nutrition education on meal and snack ideas as well as how to cook vegetables.    Pt reports she usually feels  very hungry "stomach empty" around 9pm but does not eat.  Pt reports she eats until uncomfortably full twice per week.  24-Hr Dietary Recall First Meal: bagel, hash browns Snack: crackers or fig newtons Second Meal: eat out ([employer]) or sandwich packed from home Snack: chips Third Meal: frozen dinner or eat out, or will make chicken parmesan or baked spaghetti Snack: will feel hungry but not eat Beverages: coffee, snapple   Estimated Energy Needs Calories: 1500   NUTRITION DIAGNOSIS  Overweight/obesity ([place]-3.3) related to past poor dietary habits and physical inactivity as evidenced by patient w/ planned sleeve surgery following dietary guidelines for continued weight loss.    NUTRITION INTERVENTION  Nutrition counseling (C-1) and education (E-2) to facilitate bariatric surgery goals.   Pre-Op Goals Reviewed with the Patient . Track food and beverage intake (pen and paper, MyFitness Pal, Baritastic app, etc.) . Make healthy food choices while monitoring portion sizes . Consume 3 meals per day or try to eat every 3-5 hours . Avoid concentrated sugars and fried foods . Keep sugar & fat in the single digits per serving on food labels . Practice CHEWING your food (aim for applesauce consistency) . Practice not drinking 15 minutes before, during, and 30 minutes after each meal and snack . Avoid all carbonated beverages (ex: soda, sparkling beverages)  . Limit caffeinated beverages (ex: coffee, tea, energy drinks) . Avoid all sugar-sweetened beverages (ex: regular soda, sports drinks)  . Avoid alcohol  . Aim for 64-100 ounces of FLUID daily (with at least half of fluid intake being plain water)  . Aim for at least 60-80 grams of PROTEIN daily . Look for  a liquid protein source that contains ?15 g protein and ?5 g carbohydrate (ex: shakes, drinks, shots) . Make a list of non-food related activities . Physical activity is an important part of a healthy lifestyle so keep it  moving! The goal is to reach 150 minutes of exercise per week, including cardiovascular and weight baring activity.  *Goals that are bolded indicate the pt would like to start working towards these  Handouts Provided Include  . Bariatric Surgery handouts (Nutrition Visits, Pre-Op Goals, Protein Shakes, Vitamins & Minerals)  Learning Style & Readiness for Change Teaching method utilized: Visual & Auditory  Demonstrated degree of understanding via: Teach Back  Readiness Level: action Barriers to learning/adherence to lifestyle change: none identified  RD's Notes for Next Visit . Explain the pre op goals, specific to the surgery . Evaluate effects of more balanced meals and snacks, and more frequent eating on hypoglycemic symptoms     MONITORING & EVALUATION Dietary intake, weekly physical activity, body weight, and pre-op goals reached at next nutrition visit.    Next Steps  Patient is to follow up at [facility] for continued nutrition education on May 9th.  Patient is to follow up at [facility] for Pre-Op Class >2 weeks before surgery for further nutrition education.

## 2021-02-17 ENCOUNTER — Encounter: Payer: Medicare Other | Attending: Surgery | Admitting: Skilled Nursing Facility1

## 2021-02-17 ENCOUNTER — Other Ambulatory Visit: Payer: Self-pay

## 2021-02-17 DIAGNOSIS — E669 Obesity, unspecified: Secondary | ICD-10-CM | POA: Insufficient documentation

## 2021-02-17 DIAGNOSIS — Z713 Dietary counseling and surveillance: Secondary | ICD-10-CM | POA: Insufficient documentation

## 2021-02-17 DIAGNOSIS — Z79899 Other long term (current) drug therapy: Secondary | ICD-10-CM | POA: Insufficient documentation

## 2021-02-17 DIAGNOSIS — K219 Gastro-esophageal reflux disease without esophagitis: Secondary | ICD-10-CM | POA: Diagnosis not present

## 2021-02-17 DIAGNOSIS — Z6837 Body mass index (BMI) 37.0-37.9, adult: Secondary | ICD-10-CM | POA: Insufficient documentation

## 2021-02-17 DIAGNOSIS — I10 Essential (primary) hypertension: Secondary | ICD-10-CM | POA: Diagnosis not present

## 2021-02-17 DIAGNOSIS — Z6830 Body mass index (BMI) 30.0-30.9, adult: Secondary | ICD-10-CM

## 2021-02-17 NOTE — Progress Notes (Signed)
Supervised Weight Loss Visit Bariatric Nutrition Education  Planned Surgery: sleeve  Pt completed visits.   Pt has cleared nutrition requirements.    NUTRITION ASSESSMENT   Anthropometrics  Start weight at [facility]: 210.2 lb lbs (date: 02/03/2021)  Weight: 209.2 pounds Height: 63 in BMI: 37.06 kg/m2     Clinical  Medical hx: hypertension, acid reflux, anxiety, depression Medications:  Pt reported from handwritten list -  Etodolac ER, Ozempic, topiramate, tolterodine, verapamil, vitamin D2, bupropion, omeprazole, chlorzoxaxine Labs: HgA1c 5.1 (2020) Notable signs/symptoms: no Any previous deficiencies? No  Lifestyle & Dietary Hx  Pt states she does have a son with autism who is very active and learning how to channel his condition.  Pt states she is still feeling the low blood sugar type symptoms and keeps crackers on her to mitigate this.  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   Pt states she feels she may worry about how her body will respond without having sugar after surgery.  Pt states she eats very slowly and drinks throughout the day.   Estimated daily fluid intake: unknown oz Supplements:  Current average weekly physical activity:   24-Hr Dietary Recall First Meal: bacon egg and cheese sandwich (eaten out) Snack: granola bar/cracker Second Meal: bought out Snack:  Third Meal: eaten out  Snack:  Beverages: regular juice  Estimated Energy Needs Calories: 1600   NUTRITION DIAGNOSIS  Overweight/obesity ([place]-3.3) related to past poor dietary habits and physical inactivity as evidenced by patient w/ planned sleeve gastrectomy surgery following dietary guidelines for continued weight loss.   NUTRITION INTERVENTION  Nutrition counseling (C-1) and education (E-2) to facilitate  bariatric surgery goals.  Pre-Op Goals Progress & New Goals . Make healthy food choices while monitoring portion sizes . Consume 3 meals per day or try to eat every 3-5 hours . Keep sugar & fat in the single digits per serving on food labels . NEW: avoid drinking sugary beverages, choose sugar free options  Handouts Provided Include   N/A  Learning Style & Readiness for Change Teaching method utilized: Visual & Auditory  Demonstrated degree of understanding via: Teach Back  Readiness Level: contemplative  Barriers to learning/adherence to lifestyle change: school schedule   MONITORING & EVALUATION Dietary intake, weekly physical activity, body weight, and pre-op goals Next Steps  Patient is to return to [facility] for pre-op class Pt has completed visits. No further supervised visits required/recomended

## 2021-02-24 ENCOUNTER — Ambulatory Visit (HOSPITAL_COMMUNITY)
Admission: EM | Admit: 2021-02-24 | Discharge: 2021-02-24 | Disposition: A | Discharge status: Home / Self Care | Payer: Medicare Other | Attending: Internal Medicine | Admitting: Internal Medicine

## 2021-02-24 ENCOUNTER — Other Ambulatory Visit: Payer: Self-pay

## 2021-02-24 ENCOUNTER — Encounter (HOSPITAL_COMMUNITY): Payer: Self-pay

## 2021-02-24 DIAGNOSIS — G44209 Tension-type headache, unspecified, not intractable: Secondary | ICD-10-CM

## 2021-02-24 DIAGNOSIS — R519 Headache, unspecified: Secondary | ICD-10-CM

## 2021-02-24 MED ORDER — KETOROLAC TROMETHAMINE 30 MG/ML IJ SOLN
INTRAMUSCULAR | Status: AC
  Filled 2021-02-24: qty 1

## 2021-02-24 MED ORDER — METHOCARBAMOL 500 MG PO TABS: 500.0000 mg | ORAL_TABLET | Freq: Three times a day (TID) | ORAL | 0 refills | Status: DC | PRN

## 2021-02-24 MED ORDER — KETOROLAC TROMETHAMINE 30 MG/ML IJ SOLN
30.0000 mg | Freq: Once | INTRAMUSCULAR | Status: AC
  Administered 2021-02-24: 30 mg via INTRAMUSCULAR

## 2021-02-24 NOTE — Discharge Instructions (Signed)
I really feel like your headache and neck pain are related to muscle tension or possibly an atypical migraine.  Take muscle relaxer as we discussed.  This medication may make you sleepy so no driving while taking.  You can start Motrin 800mg  at hours as needed tomorrow.  You will need to go to the emergency department for any worsening symptoms, slurred speech or weakness.

## 2021-02-24 NOTE — ED Triage Notes (Addendum)
Pt in with c/o right side head pressure that started yesterday while she was arguing  Pt states she noticed that when she was arguing she was having trouble word finding and she had slurred speech.   Also states that when she bends down it feels like the pressure in her head is expanding and her head " feels fuzzy"  Denies any double vision,sob, cp or gait issues  Hand grips equal, no arm/leg drift noted, sensation intact

## 2021-02-24 NOTE — ED Provider Notes (Addendum)
[facility]    CSN: [id number] Arrival date & time: 02/24/21  0947      History   Chief Complaint Chief Complaint  Patient presents with  . head pressure    HPI Christina Cox is a 40 y.o. female with history of migraines, anxiety presents to urgent care today with pressure in head began when arguing c husband. Pressure in occipital region lasted throughout day and worsens when bending down or any movment. Concerned this am when awoke with same pressure. Feels like "heads going to explode", feels foggy and disoriented. Also difficulty with "word finding" during argument and upset but none since. Denies any focal weakness, trauma, no recent fever or chills, no nausea or vomiting.   Past Medical History:  Diagnosis Date  . Anxiety   . Back pain   . Chest pain   . Chronic migraine   . Constipation   . Depression   . Depression   . Headache   . IBS (irritable bowel syndrome)   . Interstitial cystitis   . Knee pain   . Shortness of breath   . Stiff neck   . Swelling of left lower extremity   . Swelling of lower extremity   . Trouble in sleeping   . Wears glasses     Patient Active Problem List   Diagnosis Date Noted  . Class 1 obesity with serious comorbidity and body mass index (BMI) of 30.0 to 30.9 in adult 12/29/2018  . Insulin resistance 12/08/2018  . Vitamin D deficiency 12/08/2018  . UTI (urinary tract infection) 10/23/2017  . Bipolar I disorder, most recent episode depressed (HCC) 10/20/2017  . MDD (major depressive disorder), severe (HCC) 10/20/2017  . Advanced maternal age in multigravida 12/25/2016  . Migraine without aura 06/13/2013  . Depression 06/13/2013  . Anxiety 06/13/2013  . Insomnia 06/13/2013  . Chronic migraine 06/13/2013    Past Surgical History:  Procedure Laterality Date  . BREAST ENHANCEMENT SURGERY Bilateral 2012  . BREAST SURGERY     implants  . BUNIONECTOMY Right age 89  . BUNIONECTOMY    . CESAREAN SECTION N/A  12/26/2016   Procedure: CESAREAN SECTION;  Surgeon: Philip Aspen, DO;  Location: [facility];  Service: Obstetrics;  Laterality: N/A;  . CYSTO WITH HYDRODISTENSION N/A 10/13/2013   Procedure: CYSTOSCOPY/HYDRODISTENSION;  Surgeon: Crist Fat, MD;  Location: [facility];  Service: Urology;  Laterality: N/A;  . INTRAUTERINE DEVICE (IUD) INSERTION  2013   mirena  . WISDOM TOOTH EXTRACTION  age 13    OB History    Gravida  2   Para  2   Term  2   Preterm  0   AB  0   Living  2     SAB  0   IAB  0   Ectopic  0   Multiple      Live Births  2            Home Medications    Prior to Admission medications   Medication Sig Start Date End Date Taking? Authorizing Provider  methocarbamol (ROBAXIN) 500 MG tablet Take 1 tablet (500 mg total) by mouth every 8 (eight) hours as needed for muscle spasms. 02/24/21  Yes Rolla Etienne, NP  clonazePAM (KLONOPIN) 0.5 MG tablet Take 0.5 mg by mouth 2 (two) times daily as needed for anxiety.    [provider]  lamoTRIgine (LAMICTAL) 100 MG tablet Take 1 tablet (100 mg total) by mouth every evening.  10/22/17   Oneta Rack, NP  lurasidone (LATUDA) 20 MG TABS tablet Take 1 tablet (20 mg total) by mouth daily with supper. For mood control 10/24/17   Money, Gerlene Burdock, FNP  metFORMIN (GLUCOPHAGE) 500 MG tablet Take 1 tablet (500 mg total) by mouth 2 (two) times daily with a meal. 02/21/19   Corinna Capra A, DO  topiramate (TOPAMAX) 100 MG tablet Take 1 tablet (100 mg total) by mouth 2 (two) times daily. 10/22/17   Oneta Rack, NP  Vitamin D, Ergocalciferol, (DRISDOL) 1.25 MG (50000 UT) CAPS capsule TAKE 1 CAPSULE BY MOUTH EVERY 7 DAYS 02/21/19   Roswell Nickel, DO    Family History Family History  Problem Relation Age of Onset  . Hypertension Father   . Heart attack Father   . Hyperlipidemia Father   . Heart disease Father   . Stroke Father   . Migraines Mother   . Alcohol abuse Mother   . Depression Mother   .  Mental illness Mother   . Cancer Mother   . Depression Maternal Aunt   . Depression Maternal Grandmother   . Cancer Maternal Grandmother        lung cancer-smoker    Social History Social History   Tobacco Use  . Smoking status: Former Games developer  . Smokeless tobacco: Never Used  Substance Use Topics  . Alcohol use: No  . Drug use: No     Allergies   Codeine   Review of Systems As stated in HPI otherwise negative   Physical Exam Triage Vital Signs ED Triage Vitals  Enc Vitals Group     BP 02/24/21 1011 132/76     Pulse Rate 02/24/21 1011 74     Resp 02/24/21 1011 19     Temp 02/24/21 1011 98.6 F (37 C)     Temp src --      SpO2 02/24/21 1011 96 %     Weight --      Height --      Head Circumference --      Peak Flow --      Pain Score 02/24/21 1010 6     Pain Loc --      Pain Edu? --      Excl. in GC? --    No data found.  Updated Vital Signs BP 132/76   Pulse 74   Temp 98.6 F (37 C)   Resp 19   LMP 02/19/2021 (Approximate)   SpO2 96%   Breastfeeding No   Visual Acuity Right Eye Distance:   Left Eye Distance:   Bilateral Distance:    Right Eye Near:   Left Eye Near:    Bilateral Near:     Physical Exam Constitutional:      General: She is not in acute distress.    Appearance: Normal appearance. She is not ill-appearing or toxic-appearing.  HENT:     Right Ear: Tympanic membrane normal.     Left Ear: Tympanic membrane normal.     Nose: Nose normal. No congestion or rhinorrhea.     Mouth/Throat:     Mouth: Mucous membranes are moist.  Eyes:     Pupils: Pupils are equal, round, and reactive to light.  Cardiovascular:     Rate and Rhythm: Normal rate and regular rhythm.  Pulmonary:     Effort: Pulmonary effort is normal.     Breath sounds: Normal breath sounds.  Abdominal:     General: Bowel sounds  are normal.     Palpations: Abdomen is soft.  Musculoskeletal:        General: Normal range of motion.     Cervical back: Normal range  of motion and neck supple. No rigidity or tenderness.  Lymphadenopathy:     Cervical: No cervical adenopathy.  Skin:    General: Skin is warm and dry.  Neurological:     General: No focal deficit present.     Mental Status: She is alert and oriented to person, place, and time.     Cranial Nerves: No cranial nerve deficit.     Motor: No weakness.     Coordination: Coordination normal.     Deep Tendon Reflexes: Reflexes normal.     Comments: Strength 5/5 upper and lower extremities, tongue midline, no facial asymmetry, DTR's symmetric, speech normal  Psychiatric:        Mood and Affect: Mood normal.        Behavior: Behavior normal.      UC Treatments / Results  Labs (all labs ordered are listed, but only abnormal results are displayed) Labs Reviewed - No data to display  EKG   Radiology No results found.  Procedures Procedures (including critical care time)  Medications Ordered in UC Medications  ketorolac (TORADOL) 30 MG/ML injection 30 mg (30 mg Intramuscular Given 02/24/21 1059)    Initial Impression / Assessment and Plan / UC Course  I have reviewed the triage vital signs and the nursing notes.  Pertinent labs & imaging results that were available during my care of the patient were reviewed by me and considered in my medical decision making (see chart for details).  Headache, muscle tension -No significant findings on exam, neuro exam unremarkable, VSS -Suspect headache related to stress and muscle tension versus atypical migraine -Toradol IM in office.  Okay to start NSAIDs tomorrow.  Robaxin as needed, heat  Reviewed expections re: course of current medical issues. Questions answered. Outlined signs and symptoms indicating need for more acute intervention. Pt verbalized understanding. AVS given   Final Clinical Impressions(s) / UC Diagnoses   Final diagnoses:  Acute nonintractable headache, unspecified headache type  Muscle tension headache      Discharge Instructions     I really feel like your headache and neck pain are related to muscle tension or possibly an atypical migraine.  Take muscle relaxer as we discussed.  This medication may make you sleepy so no driving while taking.  You can start Motrin 800mg  at hours as needed tomorrow.  You will need to go to the emergency department for any worsening symptoms, slurred speech or weakness.    ED Prescriptions    Medication Sig Dispense Auth. Provider   methocarbamol (ROBAXIN) 500 MG tablet Take 1 tablet (500 mg total) by mouth every 8 (eight) hours as needed for muscle spasms. 30 tablet , NP     PDMP not reviewed this encounter.   Rolla Etienne, NP 02/24/21 1255    02/26/21, NP 02/24/21 1256

## 2021-02-27 ENCOUNTER — Ambulatory Visit (INDEPENDENT_AMBULATORY_CARE_PROVIDER_SITE_OTHER): Payer: Medicare Other | Admitting: Psychology

## 2021-02-27 DIAGNOSIS — F509 Eating disorder, unspecified: Secondary | ICD-10-CM

## 2021-03-14 ENCOUNTER — Ambulatory Visit: Payer: Medicare Other | Admitting: Psychology

## 2021-04-08 ENCOUNTER — Ambulatory Visit (HOSPITAL_COMMUNITY)
Admission: EM | Admit: 2021-04-08 | Discharge: 2021-04-08 | Disposition: A | Discharge status: Home / Self Care | Payer: Medicare Other | Attending: Family Medicine | Admitting: Family Medicine

## 2021-04-08 ENCOUNTER — Other Ambulatory Visit: Payer: Self-pay

## 2021-04-08 ENCOUNTER — Encounter (HOSPITAL_COMMUNITY): Payer: Self-pay | Admitting: Emergency Medicine

## 2021-04-08 DIAGNOSIS — H02846 Edema of left eye, unspecified eyelid: Secondary | ICD-10-CM | POA: Diagnosis not present

## 2021-04-08 DIAGNOSIS — G43909 Migraine, unspecified, not intractable, without status migrainosus: Secondary | ICD-10-CM | POA: Diagnosis not present

## 2021-04-08 DIAGNOSIS — H5789 Other specified disorders of eye and adnexa: Secondary | ICD-10-CM | POA: Diagnosis not present

## 2021-04-08 MED ORDER — DEXAMETHASONE SODIUM PHOSPHATE 10 MG/ML IJ SOLN
INTRAMUSCULAR | Status: AC
  Filled 2021-04-08: qty 1

## 2021-04-08 MED ORDER — DEXAMETHASONE SODIUM PHOSPHATE 10 MG/ML IJ SOLN
10.0000 mg | Freq: Once | INTRAMUSCULAR | Status: AC
  Administered 2021-04-08: 17:00:00 10 mg via INTRAMUSCULAR

## 2021-04-08 MED ORDER — AZELASTINE HCL 0.05 % OP SOLN: 1.0000 [drp] | Freq: Two times a day (BID) | OPHTHALMIC | 1 refills | Status: DC

## 2021-04-08 NOTE — Discharge Instructions (Addendum)
Go to the ER if your symptoms worsen.

## 2021-04-08 NOTE — ED Provider Notes (Signed)
[facility]    CSN: [id number] Arrival date & time: 04/08/21  1545      History   Chief Complaint Chief Complaint  Patient presents with   Headache    2 days   Itchy Eye    1  week    HPI Christina Cox is a 40 y.o. female.   Patient presenting today with bilateral eye irritation, mild redness, clear drainage, now having worsening puffiness surrounding left eye and mildly blurry vision, headaches similar to her past migraine issues for 1 week.  Denies injury to the eye nausea, vomiting, decreased movement, severe headache, fever, extremity numbness or weakness, speech or swallowing issues.  Known history of seasonal allergies, taking Claritin for this.  Also has a history of migraines currently on Botox injections.  Supposed to be on Topamax daily but states she has slacked off of it quite a bit since getting on Botox as the Botox typically keeps things under control.  Does endorse that symptoms currently feel similar to past migraine exacerbations.  Followed by neurology for this.   Past Medical History:  Diagnosis Date   Anxiety    Back pain    Chest pain    Chronic migraine    Constipation    Depression    Depression    Headache    IBS (irritable bowel syndrome)    Interstitial cystitis    Knee pain    Shortness of breath    Stiff neck    Swelling of left lower extremity    Swelling of lower extremity    Trouble in sleeping    Wears glasses     Patient Active Problem List   Diagnosis Date Noted   Class 1 obesity with serious comorbidity and body mass index (BMI) of 30.0 to 30.9 in adult 12/29/2018   Insulin resistance 12/08/2018   Vitamin D deficiency 12/08/2018   UTI (urinary tract infection) 10/23/2017   Bipolar I disorder, most recent episode depressed (HCC) 10/20/2017   MDD (major depressive disorder), severe (HCC) 10/20/2017   Advanced maternal age in multigravida 12/25/2016   Migraine without aura 06/13/2013   Depression 06/13/2013    Anxiety 06/13/2013   Insomnia 06/13/2013   Chronic migraine 06/13/2013    Past Surgical History:  Procedure Laterality Date   BREAST ENHANCEMENT SURGERY Bilateral 2012   BREAST SURGERY     implants   BUNIONECTOMY Right age 83   BUNIONECTOMY     CESAREAN SECTION N/A 12/26/2016   Procedure: CESAREAN SECTION;  Surgeon: Philip Aspen, DO;  Location: [facility];  Service: Obstetrics;  Laterality: N/A;   CYSTO WITH HYDRODISTENSION N/A 10/13/2013   Procedure: CYSTOSCOPY/HYDRODISTENSION;  Surgeon: Crist Fat, MD;  Location: [facility];  Service: Urology;  Laterality: N/A;   INTRAUTERINE DEVICE (IUD) INSERTION  2013   mirena   WISDOM TOOTH EXTRACTION  age 14    OB History     Gravida  2   Para  2   Term  2   Preterm  0   AB  0   Living  2      SAB  0   IAB  0   Ectopic  0   Multiple      Live Births  2            Home Medications    Prior to Admission medications   Medication Sig Start Date End Date Taking? Authorizing Provider  azelastine (OPTIVAR) 0.05 % ophthalmic solution Place 1  drop into both eyes 2 (two) times daily. 04/08/21  Yes Particia Nearing, PA-C  clonazePAM (KLONOPIN) 0.5 MG tablet Take 0.5 mg by mouth 2 (two) times daily as needed for anxiety.    [provider]  lamoTRIgine (LAMICTAL) 100 MG tablet Take 1 tablet (100 mg total) by mouth every evening. 10/22/17   Oneta Rack, NP  lurasidone (LATUDA) 20 MG TABS tablet Take 1 tablet (20 mg total) by mouth daily with supper. For mood control 10/24/17   Money, Gerlene Burdock, FNP  metFORMIN (GLUCOPHAGE) 500 MG tablet Take 1 tablet (500 mg total) by mouth 2 (two) times daily with a meal. 02/21/19   Corinna Capra A, DO  methocarbamol (ROBAXIN) 500 MG tablet Take 1 tablet (500 mg total) by mouth every 8 (eight) hours as needed for muscle spasms. 02/24/21   Rolla Etienne, NP  topiramate (TOPAMAX) 100 MG tablet Take 1 tablet (100 mg total) by mouth 2 (two) times daily. 10/22/17   Oneta Rack, NP  Vitamin D, Ergocalciferol, (DRISDOL) 1.25 MG (50000 UT) CAPS capsule TAKE 1 CAPSULE BY MOUTH EVERY 7 DAYS 02/21/19   Roswell Nickel, DO    Family History Family History  Problem Relation Age of Onset   Hypertension Father    Heart attack Father    Hyperlipidemia Father    Heart disease Father    Stroke Father    Migraines Mother    Alcohol abuse Mother    Depression Mother    Mental illness Mother    Cancer Mother    Depression Maternal Aunt    Depression Maternal Grandmother    Cancer Maternal Grandmother        lung cancer-smoker    Social History Social History   Tobacco Use   Smoking status: Former    Pack years: 0.00   Smokeless tobacco: Never  Substance Use Topics   Alcohol use: No   Drug use: No     Allergies   Codeine   Review of Systems Review of Systems Per HPI  Physical Exam Triage Vital Signs ED Triage Vitals  Enc Vitals Group     BP 04/08/21 1646 (!) 145/89     Pulse Rate 04/08/21 1646 72     Resp 04/08/21 1646 16     Temp 04/08/21 1646 98.2 F (36.8 C)     Temp Source 04/08/21 1646 Oral     SpO2 04/08/21 1646 98 %     Weight --      Height --      Head Circumference --      Peak Flow --      Pain Score 04/08/21 1645 0     Pain Loc --      Pain Edu? --      Excl. in GC? --    No data found.  Updated Vital Signs BP (!) 145/89   Pulse 72   Temp 98.2 F (36.8 C) (Oral)   Resp 16   LMP 03/25/2021   SpO2 98%   Visual Acuity Right Eye Distance:   Left Eye Distance:   Bilateral Distance:    Right Eye Near:   Left Eye Near:    Bilateral Near:     Physical Exam Vitals and nursing note reviewed.  Constitutional:      Appearance: Normal appearance. She is not ill-appearing.  HENT:     Head: Atraumatic.     Nose: Nose normal.     Mouth/Throat:  Mouth: Mucous membranes are moist.  Eyes:     Extraocular Movements: Extraocular movements intact.     Pupils: Pupils are equal, round, and reactive to light.      Comments: Mild clear discharge present bilateral eyes.  Conjunctive a mildly injected bilaterally.  Periorbital area very mildly edematous on the left, no erythema, no rashes  Cardiovascular:     Rate and Rhythm: Normal rate and regular rhythm.     Heart sounds: Normal heart sounds.  Pulmonary:     Effort: Pulmonary effort is normal. No respiratory distress.     Breath sounds: Normal breath sounds. No wheezing or rales.  Musculoskeletal:        General: Normal range of motion.     Cervical back: Normal range of motion and neck supple.  Skin:    General: Skin is warm and dry.  Neurological:     General: No focal deficit present.     Mental Status: She is alert and oriented to person, place, and time.     Cranial Nerves: No cranial nerve deficit.     Motor: No weakness.     Gait: Gait normal.  Psychiatric:        Mood and Affect: Mood normal.        Thought Content: Thought content normal.        Judgment: Judgment normal.   UC Treatments / Results  Labs (all labs ordered are listed, but only abnormal results are displayed) Labs Reviewed - No data to display  EKG   Radiology No results found.  Procedures Procedures (including critical care time)  Medications Ordered in UC Medications  dexamethasone (DECADRON) injection 10 mg (10 mg Intramuscular Given 04/08/21 1722)    Initial Impression / Assessment and Plan / UC Course  I have reviewed the triage vital signs and the nursing notes.  Pertinent labs & imaging results that were available during my care of the patient were reviewed by me and considered in my medical decision making (see chart for details).     No neurologic deficits noted on exam today and symptoms have been ongoing for 1 week at this point.  Suspect allergy exacerbation versus migraine exacerbation.  Will treat with Optivar drops, compresses, Decadron IM here today.  Ophthalmology information given for close follow-up.  Should also call her neurologist and  follow-up with them.  Knows to go to the emergency department at any point that her symptoms make it worse.  Work note given. Final Clinical Impressions(s) / UC Diagnoses   Final diagnoses:  Eye irritation  Migraine without status migrainosus, not intractable, unspecified migraine type  Swelling of left eyelid     Discharge Instructions      Go to the ER if your symptoms worsen     ED Prescriptions     Medication Sig Dispense Auth. Provider   azelastine (OPTIVAR) 0.05 % ophthalmic solution Place 1 drop into both eyes 2 (two) times daily. 6 mL Particia Nearing, [place]      PDMP not reviewed this encounter.   Particia Nearing, [place] 04/08/21 1731

## 2021-04-08 NOTE — ED Notes (Signed)
Pt left with out visual acuity screening.

## 2021-04-08 NOTE — ED Triage Notes (Signed)
PT reports bilateral eye irritation, but it is worse in left eye. States she has noticed some left upper eyelid droop - feels as though it is taking effort to keep eye open. Smile symmetrical, able to raise eyebrows symmetrically. Denies unilateral weakness or numbness in hands, arms, legs.

## 2021-04-23 ENCOUNTER — Ambulatory Visit: Payer: Self-pay | Admitting: Surgery

## 2021-04-28 ENCOUNTER — Encounter: Payer: Medicare Other | Attending: Surgery | Admitting: Skilled Nursing Facility1

## 2021-04-28 ENCOUNTER — Other Ambulatory Visit: Payer: Self-pay

## 2021-04-28 DIAGNOSIS — E669 Obesity, unspecified: Secondary | ICD-10-CM | POA: Insufficient documentation

## 2021-04-28 DIAGNOSIS — Z713 Dietary counseling and surveillance: Secondary | ICD-10-CM | POA: Diagnosis not present

## 2021-04-28 DIAGNOSIS — Z6830 Body mass index (BMI) 30.0-30.9, adult: Secondary | ICD-10-CM

## 2021-04-29 NOTE — Progress Notes (Signed)
Pre-Operative Nutrition Class:    Patient was seen on 04/28/2021 for Pre-Operative Bariatric Surgery Education at [facility]    Surgery date: 05/12/2021 Surgery type: sleeve gastrectomy Start weight at [facility]: 210.2 pounds Weight today: 13 pounds   The following the learning objectives were met by the patient during this course: Identify Pre-Op Dietary Goals and will begin 2 weeks pre-operatively Identify appropriate sources of fluids and proteins  State protein recommendations and appropriate sources pre and post-operatively Identify Post-Operative Dietary Goals and will follow for 2 weeks post-operatively Identify appropriate multivitamin and calcium sources Describe the need for physical activity post-operatively and will follow MD recommendations State when to call healthcare provider regarding medication questions or post-operative complications When having a diagnosis of diabetes understanding hypoglycemia symptoms and the inclusion of 1 complex carbohydrate per meal  Handouts given during class include: Pre-Op Bariatric Surgery Diet Handout Protein Shake Handout Post-Op Bariatric Surgery Nutrition Handout BELT Program Information Flyer Support Group Information Flyer [facility] Bariatric Supplements Price List  Follow-Up Plan: Patient will follow-up at [facility] 2 weeks post operatively for diet advancement per MD.

## 2021-05-01 ENCOUNTER — Other Ambulatory Visit (HOSPITAL_COMMUNITY): Payer: Self-pay

## 2021-05-02 NOTE — Patient Instructions (Addendum)
DUE TO COVID-19 ONLY ONE VISITOR IS ALLOWED TO COME WITH YOU AND STAY IN THE WAITING ROOM ONLY DURING PRE OP AND PROCEDURE DAY OF SURGERY.   TWO VISITOR  MAY VISIT WITH YOU AFTER SURGERY IN YOUR PRIVATE ROOM DURING VISITING HOURS ONLY!  YOU NEED TO HAVE A COVID 19 TEST ON__7-28-22_____ @_______ , THIS TEST MUST BE DONE BEFORE SURGERY,    COVID TESTING SITE [address] [place] [id number],   IT IS ON THE RIGHT GOING OUT [address] APPROXIMATELY  2 MINUTES PAST [employer] ON THE RIGHT. ONCE YOUR COVID TEST IS COMPLETED,  PLEASE Wear a mask when in public           Your procedure is scheduled on: 05-12-21   Report to [facility] Main  Entrance   Report to short stay at      0515  AM     Call this number if you have problems the morning of surgery [phone]    Remember: Do not eat food :After Midnight.  You may have clear liquids until 0415 am then nothing by mouth    CLEAR LIQUID DIET   Foods Allowed                                                                     Foods Excluded Water Black Coffee and tea, regular and decaf                             liquids that you cannot  Plain Jell-O any favor except red or purple                                           see through such as: Fruit ices (not with fruit pulp)                                                   milk, soups, orange juice  Iced Popsicles                                                       All solid food                                 Cranberry, grape and apple juices Sports drinks like Gatorade Lightly seasoned clear broth or consume(fat free) Sugar, honey syrup   _____________________________________________________________________     BRUSH YOUR TEETH MORNING OF SURGERY AND RINSE YOUR MOUTH OUT, NO CHEWING GUM CANDY OR MINTS.     Take these medicines the morning of surgery with A SIP OF WATER: eye drops as usual  DO NOT TAKE ANY DIABETIC MEDICATIONS DAY OF YOUR  SURGERY  You may not have any metal on your body including hair pins and              piercings  Do not wear jewelry, make-up, lotions, powders or perfumes, deodorant             Do not wear nail polish on your fingernails or toenails .  Do not shave  48 hours prior to surgery.             Do not bring valuables to the hospital. [facility] IS NOT             RESPONSIBLE   FOR VALUABLES.  Contacts, dentures or bridgework may not be worn into surgery.       Patients discharged the day of surgery will not be allowed to drive home. IF YOU ARE HAVING SURGERY AND GOING HOME THE SAME DAY, YOU MUST HAVE AN ADULT TO DRIVE YOU HOME AND BE WITH YOU FOR 24 HOURS. YOU MAY GO HOME BY TAXI OR UBER OR ORTHERWISE, BUT AN ADULT MUST ACCOMPANY YOU HOME AND STAY WITH YOU FOR 24 HOURS.  Name and phone number of your driver:  Special Instructions: N/A              Please read over the following fact sheets you were given: _____________________________________________________________________             [facility] - Preparing for Surgery Before surgery, you can play an important role.  Because skin is not sterile, your skin needs to be as free of germs as possible.  You can reduce the number of germs on your skin by washing with CHG (chlorahexidine gluconate) soap before surgery.  CHG is an antiseptic cleaner which kills germs and bonds with the skin to continue killing germs even after washing. Please DO NOT use if you have an allergy to CHG or antibacterial soaps.  If your skin becomes reddened/irritated stop using the CHG and inform your nurse when you arrive at Short Stay. Do not shave (including legs and underarms) for at least 48 hours prior to the first CHG shower.  You may shave your face/neck. Please follow these instructions carefully:  1.  Shower with CHG Soap the night before surgery and the  morning of Surgery.  2.  If you choose to wash your hair, wash your hair  first as usual with your  normal  shampoo.  3.  After you shampoo, rinse your hair and body thoroughly to remove the  shampoo.                           4.  Use CHG as you would any other liquid soap.  You can apply chg directly  to the skin and wash                       Gently with a scrungie or clean washcloth.  5.  Apply the CHG Soap to your body ONLY FROM THE NECK DOWN.   Do not use on face/ open                           Wound or open sores. Avoid contact with eyes, ears mouth and genitals (private parts).                       Wash face,  Medical illustrator (private  parts) with your normal soap.             6.  Wash thoroughly, paying special attention to the area where your surgery  will be performed.  7.  Thoroughly rinse your body with warm water from the neck down.  8.  DO NOT shower/wash with your normal soap after using and rinsing off  the CHG Soap.                9.  Pat yourself dry with a clean towel.            10.  Wear clean pajamas.            11.  Place clean sheets on your bed the night of your first shower and do not  sleep with pets. Day of Surgery : Do not apply any lotions/deodorants the morning of surgery.  Please wear clean clothes to the hospital/surgery center.  FAILURE TO FOLLOW THESE INSTRUCTIONS MAY RESULT IN THE CANCELLATION OF YOUR SURGERY PATIENT SIGNATURE_________________________________  NURSE SIGNATURE__________________________________  ___  Incentive Spirometer  An incentive spirometer is a tool that can help keep your lungs clear and active. This tool measures how well you are filling your lungs with each breath. Taking long deep breaths may help reverse or decrease the chance of developing breathing (pulmonary) problems (especially infection) following: A long period of time when you are unable to move or be active. BEFORE THE PROCEDURE  If the spirometer includes an indicator to show your best effort, your nurse or respiratory therapist will set it to a  desired goal. If possible, sit up straight or lean slightly forward. Try not to slouch. Hold the incentive spirometer in an upright position. INSTRUCTIONS FOR USE  Sit on the edge of your bed if possible, or sit up as far as you can in bed or on a chair. Hold the incentive spirometer in an upright position. Breathe out normally. Place the mouthpiece in your mouth and seal your lips tightly around it. Breathe in slowly and as deeply as possible, raising the piston or the ball toward the top of the column. Hold your breath for 3-5 seconds or for as long as possible. Allow the piston or ball to fall to the bottom of the column. Remove the mouthpiece from your mouth and breathe out normally. Rest for a few seconds and repeat Steps 1 through 7 at least 10 times every 1-2 hours when you are awake. Take your time and take a few normal breaths between deep breaths. The spirometer may include an indicator to show your best effort. Use the indicator as a goal to work toward during each repetition. After each set of 10 deep breaths, practice coughing to be sure your lungs are clear. If you have an incision (the cut made at the time of surgery), support your incision when coughing by placing a pillow or rolled up towels firmly against it. Once you are able to get out of bed, walk around indoors and cough well. You may stop using the incentive spirometer when instructed by your caregiver.  RISKS AND COMPLICATIONS Take your time so you do not get dizzy or light-headed. If you are in pain, you may need to take or ask for pain medication before doing incentive spirometry. It is harder to take a deep breath if you are having pain. AFTER USE Rest and breathe slowly and easily. It can be helpful to keep track of a log of your progress. Your  caregiver can provide you with a simple table to help with this. If you are using the spirometer at home, follow these instructions: SEEK MEDICAL CARE IF:  You are having  difficultly using the spirometer. You have trouble using the spirometer as often as instructed. Your pain medication is not giving enough relief while using the spirometer. You develop fever of 100.5 F (38.1 C) or higher. SEEK IMMEDIATE MEDICAL CARE IF:  You cough up bloody sputum that had not been present before. You develop fever of 102 F (38.9 C) or greater. You develop worsening pain at or near the incision site. MAKE SURE YOU:  Understand these instructions. Will watch your condition. Will get help right away if you are not doing well or get worse. Document Released: 02/08/2007 Document Revised: 12/21/2011 Document Reviewed: 04/11/2007 [facility] Patient Information 2014 [employer], [place].   ________________________________________________________________________ _____________________________________________________________________

## 2021-05-05 ENCOUNTER — Other Ambulatory Visit: Payer: Self-pay

## 2021-05-05 ENCOUNTER — Encounter (HOSPITAL_COMMUNITY)
Admission: RE | Admit: 2021-05-05 | Discharge: 2021-05-05 | Disposition: A | Discharge status: Home / Self Care | Payer: Medicare Other | Source: Ambulatory Visit | Attending: Surgery | Admitting: Surgery

## 2021-05-05 ENCOUNTER — Encounter (HOSPITAL_COMMUNITY): Payer: Self-pay

## 2021-05-05 DIAGNOSIS — Z01818 Encounter for other preprocedural examination: Secondary | ICD-10-CM | POA: Insufficient documentation

## 2021-05-05 HISTORY — DX: Essential (primary) hypertension: I10

## 2021-05-05 LAB — CBC WITH DIFFERENTIAL/PLATELET
Abs Immature Granulocytes: 0.02 K/uL (ref 0.00–0.07)
Basophils Absolute: 0 K/uL (ref 0.0–0.1)
Basophils Relative: 0 %
Eosinophils Absolute: 0.1 K/uL (ref 0.0–0.5)
Eosinophils Relative: 2 %
HCT: 46.8 % — ABNORMAL HIGH (ref 36.0–46.0)
Hemoglobin: 15.4 g/dL — ABNORMAL HIGH (ref 12.0–15.0)
Immature Granulocytes: 0 %
Lymphocytes Relative: 23 %
Lymphs Abs: 2 K/uL (ref 0.7–4.0)
MCH: 28.9 pg (ref 26.0–34.0)
MCHC: 32.9 g/dL (ref 30.0–36.0)
MCV: 88 fL (ref 80.0–100.0)
Monocytes Absolute: 0.5 K/uL (ref 0.1–1.0)
Monocytes Relative: 5 %
Neutro Abs: 6 K/uL (ref 1.7–7.7)
Neutrophils Relative %: 70 %
Platelets: 318 K/uL (ref 150–400)
RBC: 5.32 MIL/uL — ABNORMAL HIGH (ref 3.87–5.11)
RDW: 12.6 % (ref 11.5–15.5)
WBC: 8.7 K/uL (ref 4.0–10.5)
nRBC: 0 % (ref 0.0–0.2)

## 2021-05-05 LAB — COMPREHENSIVE METABOLIC PANEL WITH GFR
ALT: 19 U/L (ref 0–44)
AST: 20 U/L (ref 15–41)
Albumin: 4 g/dL (ref 3.5–5.0)
Alkaline Phosphatase: 106 U/L (ref 38–126)
Anion gap: 7 (ref 5–15)
BUN: 8 mg/dL (ref 6–20)
CO2: 23 mmol/L (ref 22–32)
Calcium: 9 mg/dL (ref 8.9–10.3)
Chloride: 108 mmol/L (ref 98–111)
Creatinine, Ser: 0.73 mg/dL (ref 0.44–1.00)
GFR, Estimated: >60 mL/min
Glucose, Bld: 127 mg/dL — ABNORMAL HIGH (ref 70–99)
Potassium: 3.6 mmol/L (ref 3.5–5.1)
Sodium: 138 mmol/L (ref 135–145)
Total Bilirubin: 0.6 mg/dL (ref 0.3–1.2)
Total Protein: 7.4 g/dL (ref 6.5–8.1)

## 2021-05-05 NOTE — Progress Notes (Signed)
PCP - Dr. Leandrew Koyanagi  Cardiologist - no  PPM/ICD -  Device Orders -  Rep Notified -   Chest x-ray -  EKG - 12-16-20 Stress Test -  ECHO - 2020 Cardiac Cath -   Sleep Study -  CPAP -   Fasting Blood Sugar -  Checks Blood Sugar _____ times a day  Blood Thinner Instructions: Aspirin Instructions:  ERAS Protcol - PRE-SURGERY Ensure or G2-   COVID TEST-  Activity--Able to walk to mailbox without SOB or CP  Anesthesia review: HTN  Patient denies shortness of breath, fever, cough and chest pain at PAT appointment   All instructions explained to the patient, with a verbal understanding of the material. Patient agrees to go over the instructions while at home for a better understanding. Patient also instructed to self quarantine after being tested for COVID-19. The opportunity to ask questions was provided.

## 2021-05-08 ENCOUNTER — Other Ambulatory Visit (HOSPITAL_COMMUNITY)
Admission: RE | Admit: 2021-05-08 | Discharge: 2021-05-08 | Disposition: A | Discharge status: Home / Self Care | Payer: Medicare Other | Source: Ambulatory Visit | Attending: Surgery | Admitting: Surgery

## 2021-05-08 DIAGNOSIS — Z20822 Contact with and (suspected) exposure to covid-19: Secondary | ICD-10-CM | POA: Diagnosis not present

## 2021-05-08 DIAGNOSIS — Z01812 Encounter for preprocedural laboratory examination: Secondary | ICD-10-CM | POA: Insufficient documentation

## 2021-05-08 LAB — SARS CORONAVIRUS 2 (TAT 6-24 HRS): SARS Coronavirus 2: NEGATIVE

## 2021-05-11 MED ORDER — BUPIVACAINE LIPOSOME 1.3 % IJ SUSP
20.0000 mL | INTRAMUSCULAR | Status: DC
  Filled 2021-05-11: qty 20

## 2021-05-11 NOTE — Anesthesia Preprocedure Evaluation (Addendum)
Anesthesia Evaluation  Patient identified by MRN, date of birth, ID band Patient awake    Reviewed: Allergy & Precautions, H&P , NPO status , Patient's Chart, lab work & pertinent test results  Airway Mallampati: II  TM Distance: >3 FB Neck ROM: Full    Dental no notable dental hx. (+) Dental Advisory Given, Teeth Intact   Pulmonary former smoker,    Pulmonary exam normal breath sounds clear to auscultation       Cardiovascular hypertension, Pt. on medications Normal cardiovascular exam Rhythm:Regular Rate:Normal     Neuro/Psych  Headaches, PSYCHIATRIC DISORDERS Anxiety Depression Bipolar Disorder negative neurological ROS     GI/Hepatic negative GI ROS, Neg liver ROS,   Endo/Other    Renal/GU negative Renal ROS     Musculoskeletal negative musculoskeletal ROS (+)   Abdominal (+) + obese,   Peds  Hematology negative hematology ROS (+)   Anesthesia Other Findings   Reproductive/Obstetrics negative OB ROS                            Anesthesia Physical  Anesthesia Plan  ASA: 2  Anesthesia Plan: General   Post-op Pain Management:    Induction: Intravenous  PONV Risk Score and Plan: 4 or greater and Ondansetron, Aprepitant, Dexamethasone, Treatment may vary due to age or medical condition, Midazolam, Diphenhydramine and Propofol infusion  Airway Management Planned: Oral ETT  Additional Equipment: None  Intra-op Plan:   Post-operative Plan: Extubation in OR  Informed Consent: I have reviewed the patients History and Physical, chart, labs and discussed the procedure including the risks, benefits and alternatives for the proposed anesthesia with the patient or authorized representative who has indicated his/her understanding and acceptance.     Dental advisory given  Plan Discussed with: CRNA  Anesthesia Plan Comments:        Anesthesia Quick Evaluation

## 2021-05-11 NOTE — H&P (Signed)
CC Obesity History of Present Illness: Christina Cox is a 40 y.o. female who presents today for robotic sleeve gastrectomy.  .   She has tried numerous diets including medications for weight loss with marginal success. She has a 85-year-old toddler that she is trying to keep up with and is having difficulty because her weight is causing her pain in her right knee and in her back. She was seen by me in February we discussed sleeve gastrectomy in detail and she has been through the work-up for that. She had an upper GI series this past March that was read interpreted as normal although she does have some issue with gastroesophageal reflux. She understands the risk that sleeve gastrectomy might make the problem worse. She understands the operation in some detail and I went over it again today including how it is done. She is aware of the risk of leaks and bleeds. She is ready to undergo her surgery on May 12, 2021 and I have placed orders and [facility] epic for that to happen.  Review of Systems: See HPI as well for other ROS.  Review of Systems  Gastrointestinal: Positive for heartburn.  Musculoskeletal: Positive for back pain and joint pain.    Medical History: Past Medical History:  Diagnosis Date   Anxiety   GERD (gastroesophageal reflux disease)   Hypertension   There is no problem list on file for this patient.  Past Surgical History:  Procedure Laterality Date   foot surgery   REDUCTION MAMMAPLASTY   wisdom teeth extraction    Allergies  Allergen Reactions   Codeine Itching   Current Outpatient Medications on File Prior to Visit  Medication Sig Dispense Refill   buPROPion (WELLBUTRIN) 100 MG tablet Take 1 pill daily   chlorzoxazone (PARAFON FORTE) 500 mg tablet Take by mouth 3 (three) times daily as needed   topiramate (TOPAMAX) 200 MG tablet Take by mouth   atenoloL (TENORMIN) 25 MG tablet atenolol 25 mg tablet   clonazePAM (KLONOPIN) 1 MG tablet clonazepam 1 mg  tablet TK 1 T PO BID   desvenlafaxine succinate 25 mg Tb24 desvenlafaxine succinate ER 25 mg tablet,extended release 24 hr TK 1 T PO QAM   imipramine (TOFRANIL) 25 MG tablet imipramine 25 mg tablet   lamoTRIgine (LAMICTAL) 100 MG tablet lamotrigine 100 mg tablet TK 1 T PO QAM   No current facility-administered medications on file prior to visit.   Family History  Problem Relation Age of Onset   Stroke Father   High blood pressure (Hypertension) Father   Hyperlipidemia (Elevated cholesterol) Father   Coronary Artery Disease (Blocked arteries around heart) Father    Social History   Tobacco Use  Smoking Status Former Smoker   Types: Cigarettes   Quit date: 2009   Years since quitting: 13.5  Smokeless Tobacco Never Used    Social History   Socioeconomic History   Marital status: Married  Tobacco Use   Smoking status: Former Smoker  Types: Cigarettes  Quit date: 2009  Years since quitting: 13.5   Smokeless tobacco: Never Used  Substance and Sexual Activity   Alcohol use: Never   Drug use: Never   Objective:   Vitals:  04/23/21 1422  BP: 130/80  Pulse: 98  Temp: 36.4 C (97.5 F)  SpO2: 98%  Weight: 96 kg (211 lb 9.6 oz)  Height: 160 cm (5\' 3" )   Body mass index is 37.48 kg/m.  Physical Exam General: Obese white female in no acute distress HEENT:  Unremarkable Chest: Clear Heart: Sinus rhythm without murmurs or gallops Breast: Not examined Abdomen: No prior abdominal surgery GU not examined Rectal not examined Extremities pain in right knee Neuro alert and oriented x3. Motor and sensory function are grossly intact. Cognitive function good and fund of knowledge seems good particularly regarding sleeve gastrectomy.  Labs, Imaging and Diagnostic Testing: Upper GI as reported above  Assessment and Plan:  Diagnoses and all orders for this visit:  Class 2 obesity due to excess calories without serious comorbidity with body mass index (BMI) of 37.0 to 37.9  in adult    She is scheduled for sleeve gastrectomy on August 1. She is all set and orders were placed in [facility] epic. She is followed by Dr. Jeannetta Nap in [place].   Matt B. Daphine Deutscher, MD, FACS

## 2021-05-12 ENCOUNTER — Inpatient Hospital Stay (HOSPITAL_COMMUNITY): Payer: Medicare Other | Admitting: Anesthesiology

## 2021-05-12 ENCOUNTER — Encounter (HOSPITAL_COMMUNITY)
Admission: RE | Disposition: A | Discharge status: Home / Self Care | Payer: Self-pay | Source: Home / Self Care | Attending: Surgery

## 2021-05-12 ENCOUNTER — Inpatient Hospital Stay (HOSPITAL_COMMUNITY)
Admission: RE | Admit: 2021-05-12 | Discharge: 2021-05-14 | DRG: 621 | Disposition: A | Discharge status: Home / Self Care | Payer: Medicare Other | Attending: Surgery | Admitting: Surgery

## 2021-05-12 ENCOUNTER — Other Ambulatory Visit: Payer: Self-pay

## 2021-05-12 ENCOUNTER — Encounter (HOSPITAL_COMMUNITY): Payer: Self-pay | Admitting: Surgery

## 2021-05-12 DIAGNOSIS — F419 Anxiety disorder, unspecified: Secondary | ICD-10-CM | POA: Diagnosis present

## 2021-05-12 DIAGNOSIS — Z8249 Family history of ischemic heart disease and other diseases of the circulatory system: Secondary | ICD-10-CM

## 2021-05-12 DIAGNOSIS — K219 Gastro-esophageal reflux disease without esophagitis: Secondary | ICD-10-CM | POA: Diagnosis present

## 2021-05-12 DIAGNOSIS — Z823 Family history of stroke: Secondary | ICD-10-CM

## 2021-05-12 DIAGNOSIS — E661 Drug-induced obesity: Principal | ICD-10-CM

## 2021-05-12 DIAGNOSIS — Z87891 Personal history of nicotine dependence: Secondary | ICD-10-CM

## 2021-05-12 DIAGNOSIS — Z903 Acquired absence of stomach [part of]: Secondary | ICD-10-CM

## 2021-05-12 DIAGNOSIS — Z6837 Body mass index (BMI) 37.0-37.9, adult: Secondary | ICD-10-CM

## 2021-05-12 DIAGNOSIS — Z6830 Body mass index (BMI) 30.0-30.9, adult: Secondary | ICD-10-CM

## 2021-05-12 DIAGNOSIS — I10 Essential (primary) hypertension: Secondary | ICD-10-CM | POA: Diagnosis present

## 2021-05-12 DIAGNOSIS — Z79899 Other long term (current) drug therapy: Secondary | ICD-10-CM

## 2021-05-12 HISTORY — DX: Acquired absence of stomach (part of): Z90.3

## 2021-05-12 HISTORY — PX: UPPER GI ENDOSCOPY: SHX6162

## 2021-05-12 HISTORY — PX: LAPAROSCOPIC GASTRIC SLEEVE RESECTION: SHX5895

## 2021-05-12 LAB — PREGNANCY, URINE: Preg Test, Ur: NEGATIVE

## 2021-05-12 LAB — CBC
HCT: 47.2 % — ABNORMAL HIGH (ref 36.0–46.0)
Hemoglobin: 15.2 g/dL — ABNORMAL HIGH (ref 12.0–15.0)
MCH: 28.6 pg (ref 26.0–34.0)
MCHC: 32.2 g/dL (ref 30.0–36.0)
MCV: 88.9 fL (ref 80.0–100.0)
Platelets: 335 10*3/uL (ref 150–400)
RBC: 5.31 MIL/uL — ABNORMAL HIGH (ref 3.87–5.11)
RDW: 12.6 % (ref 11.5–15.5)
WBC: 17.3 10*3/uL — ABNORMAL HIGH (ref 4.0–10.5)
nRBC: 0 % (ref 0.0–0.2)

## 2021-05-12 LAB — CREATININE, SERUM
Creatinine, Ser: 0.89 mg/dL (ref 0.44–1.00)
GFR, Estimated: >60 mL/min (ref >60)

## 2021-05-12 LAB — TYPE AND SCREEN
ABO/RH(D): A POS
Antibody Screen: NEGATIVE

## 2021-05-12 LAB — HEMOGLOBIN AND HEMATOCRIT, BLOOD
HCT: 47.4 % — ABNORMAL HIGH (ref 36.0–46.0)
Hemoglobin: 15.1 g/dL — ABNORMAL HIGH (ref 12.0–15.0)

## 2021-05-12 LAB — ABO/RH: ABO/RH(D): A POS

## 2021-05-12 SURGERY — XI ROBOTIC GASTRIC SLEEVE RESECTION
Anesthesia: General | Site: Esophagus

## 2021-05-12 MED ORDER — PROPOFOL 10 MG/ML IV BOLUS
INTRAVENOUS | Status: AC
  Filled 2021-05-12: qty 20

## 2021-05-12 MED ORDER — DEXAMETHASONE SODIUM PHOSPHATE 10 MG/ML IJ SOLN
INTRAMUSCULAR | Status: DC | PRN
  Administered 2021-05-12: 10 mg via INTRAVENOUS

## 2021-05-12 MED ORDER — CHLORHEXIDINE GLUCONATE CLOTH 2 % EX PADS: 6.0000 | MEDICATED_PAD | Freq: Once | CUTANEOUS | Status: DC

## 2021-05-12 MED ORDER — PROPOFOL 10 MG/ML IV BOLUS
INTRAVENOUS | Status: DC | PRN
  Administered 2021-05-12: 200 mg via INTRAVENOUS

## 2021-05-12 MED ORDER — LIDOCAINE 2% (20 MG/ML) 5 ML SYRINGE
INTRAMUSCULAR | Status: AC
  Filled 2021-05-12: qty 5

## 2021-05-12 MED ORDER — HYDROMORPHONE HCL 1 MG/ML IJ SOLN
INTRAMUSCULAR | Status: AC
  Filled 2021-05-12: qty 1

## 2021-05-12 MED ORDER — ROCURONIUM BROMIDE 10 MG/ML (PF) SYRINGE
PREFILLED_SYRINGE | INTRAVENOUS | Status: AC
  Filled 2021-05-12: qty 10

## 2021-05-12 MED ORDER — LACTATED RINGERS IV SOLN: INTRAVENOUS | Status: DC

## 2021-05-12 MED ORDER — HYDROMORPHONE HCL 1 MG/ML IJ SOLN
0.2500 mg | INTRAMUSCULAR | Status: DC | PRN
  Administered 2021-05-12 (×3): 0.5 mg via INTRAVENOUS

## 2021-05-12 MED ORDER — FENTANYL CITRATE (PF) 250 MCG/5ML IJ SOLN
INTRAMUSCULAR | Status: AC
  Filled 2021-05-12: qty 5

## 2021-05-12 MED ORDER — DEXAMETHASONE SODIUM PHOSPHATE 10 MG/ML IJ SOLN
INTRAMUSCULAR | Status: AC
  Filled 2021-05-12: qty 1

## 2021-05-12 MED ORDER — PROMETHAZINE HCL 25 MG/ML IJ SOLN
6.2500 mg | INTRAMUSCULAR | Status: DC | PRN
  Administered 2021-05-12: 12.5 mg via INTRAVENOUS

## 2021-05-12 MED ORDER — 0.9 % SODIUM CHLORIDE (POUR BTL) OPTIME
TOPICAL | Status: DC | PRN
  Administered 2021-05-12: 1000 mL

## 2021-05-12 MED ORDER — SODIUM CHLORIDE (PF) 0.9 % IJ SOLN
INTRAMUSCULAR | Status: DC | PRN
  Administered 2021-05-12: 10 mL

## 2021-05-12 MED ORDER — GABAPENTIN 300 MG PO CAPS
300.0000 mg | ORAL_CAPSULE | ORAL | Status: AC
  Administered 2021-05-12: 300 mg via ORAL
  Filled 2021-05-12: qty 1

## 2021-05-12 MED ORDER — AMISULPRIDE (ANTIEMETIC) 5 MG/2ML IV SOLN
INTRAVENOUS | Status: AC
  Filled 2021-05-12: qty 2

## 2021-05-12 MED ORDER — TOPIRAMATE 100 MG PO TABS
100.0000 mg | ORAL_TABLET | Freq: Two times a day (BID) | ORAL | Status: DC
Start: 2021-05-12 — End: 2021-05-14
  Administered 2021-05-12 – 2021-05-14 (×4): 100 mg via ORAL
  Filled 2021-05-12 (×4): qty 1

## 2021-05-12 MED ORDER — MIDAZOLAM HCL 2 MG/2ML IJ SOLN
INTRAMUSCULAR | Status: AC
  Filled 2021-05-12: qty 2

## 2021-05-12 MED ORDER — BUPIVACAINE LIPOSOME 1.3 % IJ SUSP
INTRAMUSCULAR | Status: DC | PRN
  Administered 2021-05-12: 20 mL

## 2021-05-12 MED ORDER — APREPITANT 40 MG PO CAPS
40.0000 mg | ORAL_CAPSULE | ORAL | Status: AC
  Administered 2021-05-12: 40 mg via ORAL
  Filled 2021-05-12: qty 1

## 2021-05-12 MED ORDER — ENSURE MAX PROTEIN PO LIQD
2.0000 [oz_av] | ORAL | Status: DC
  Administered 2021-05-13 (×2): 2 [oz_av] via ORAL

## 2021-05-12 MED ORDER — PROMETHAZINE HCL 25 MG/ML IJ SOLN
INTRAMUSCULAR | Status: AC
  Filled 2021-05-12: qty 1

## 2021-05-12 MED ORDER — SODIUM CHLORIDE 0.9 % IV SOLN
2.0000 g | INTRAVENOUS | Status: AC
  Administered 2021-05-12: 2 g via INTRAVENOUS
  Filled 2021-05-12 (×2): qty 2

## 2021-05-12 MED ORDER — KETAMINE HCL 10 MG/ML IJ SOLN
INTRAMUSCULAR | Status: DC | PRN
  Administered 2021-05-12: 10 mg via INTRAVENOUS
  Administered 2021-05-12: 20 mg via INTRAVENOUS

## 2021-05-12 MED ORDER — ACETAMINOPHEN 160 MG/5ML PO SOLN: 1000.0000 mg | Freq: Three times a day (TID) | ORAL | Status: DC

## 2021-05-12 MED ORDER — ORAL CARE MOUTH RINSE: 15.0000 mL | Freq: Once | OROMUCOSAL | Status: AC

## 2021-05-12 MED ORDER — AMISULPRIDE (ANTIEMETIC) 5 MG/2ML IV SOLN
10.0000 mg | Freq: Once | INTRAVENOUS | Status: AC | PRN
  Administered 2021-05-12: 10 mg via INTRAVENOUS

## 2021-05-12 MED ORDER — MEPERIDINE HCL 50 MG/ML IJ SOLN: 6.2500 mg | INTRAMUSCULAR | Status: DC | PRN

## 2021-05-12 MED ORDER — LIDOCAINE 2% (20 MG/ML) 5 ML SYRINGE
INTRAMUSCULAR | Status: DC | PRN
Start: 2021-05-12 — End: 2021-05-12
  Administered 2021-05-12: 100 mg via INTRAVENOUS

## 2021-05-12 MED ORDER — SUGAMMADEX SODIUM 200 MG/2ML IV SOLN
INTRAVENOUS | Status: DC | PRN
  Administered 2021-05-12: 200 mg via INTRAVENOUS

## 2021-05-12 MED ORDER — DROPERIDOL 2.5 MG/ML IJ SOLN: 0.6250 mg | Freq: Once | INTRAMUSCULAR | Status: DC | PRN

## 2021-05-12 MED ORDER — LACTATED RINGERS IR SOLN
Status: DC | PRN
  Administered 2021-05-12: 1000 mL

## 2021-05-12 MED ORDER — SODIUM CHLORIDE (PF) 0.9 % IJ SOLN
INTRAMUSCULAR | Status: AC
  Filled 2021-05-12: qty 10

## 2021-05-12 MED ORDER — ACETAMINOPHEN 500 MG PO TABS
1000.0000 mg | ORAL_TABLET | Freq: Three times a day (TID) | ORAL | Status: DC
  Administered 2021-05-12 – 2021-05-14 (×7): 1000 mg via ORAL
  Filled 2021-05-12 (×7): qty 2

## 2021-05-12 MED ORDER — HEPARIN SODIUM (PORCINE) 5000 UNIT/ML IJ SOLN
5000.0000 [IU] | INTRAMUSCULAR | Status: AC
  Administered 2021-05-12: 5000 [IU] via SUBCUTANEOUS
  Filled 2021-05-12: qty 1

## 2021-05-12 MED ORDER — PROMETHAZINE HCL 25 MG/ML IJ SOLN: 12.5000 mg | Freq: Once | INTRAMUSCULAR | Status: DC | PRN

## 2021-05-12 MED ORDER — CHLORHEXIDINE GLUCONATE 0.12 % MT SOLN
15.0000 mL | Freq: Once | OROMUCOSAL | Status: AC
  Administered 2021-05-12: 15 mL via OROMUCOSAL

## 2021-05-12 MED ORDER — OXYCODONE HCL 5 MG/5ML PO SOLN
5.0000 mg | Freq: Four times a day (QID) | ORAL | Status: DC | PRN
  Administered 2021-05-12 – 2021-05-13 (×6): 5 mg via ORAL
  Filled 2021-05-12 (×6): qty 5

## 2021-05-12 MED ORDER — ONDANSETRON HCL 4 MG/2ML IJ SOLN
INTRAMUSCULAR | Status: DC | PRN
  Administered 2021-05-12: 4 mg via INTRAVENOUS

## 2021-05-12 MED ORDER — MORPHINE SULFATE (PF) 2 MG/ML IV SOLN
1.0000 mg | INTRAVENOUS | Status: DC | PRN
  Administered 2021-05-12: 2 mg via INTRAVENOUS
  Filled 2021-05-12: qty 1

## 2021-05-12 MED ORDER — HEPARIN SODIUM (PORCINE) 5000 UNIT/ML IJ SOLN
5000.0000 [IU] | Freq: Three times a day (TID) | INTRAMUSCULAR | Status: DC
  Administered 2021-05-12 – 2021-05-14 (×7): 5000 [IU] via SUBCUTANEOUS
  Filled 2021-05-12 (×7): qty 1

## 2021-05-12 MED ORDER — LAMOTRIGINE 100 MG PO TABS: 100.0000 mg | ORAL_TABLET | Freq: Every evening | ORAL | Status: DC

## 2021-05-12 MED ORDER — MIDAZOLAM HCL 5 MG/5ML IJ SOLN
INTRAMUSCULAR | Status: DC | PRN
  Administered 2021-05-12: 2 mg via INTRAVENOUS

## 2021-05-12 MED ORDER — FENTANYL CITRATE (PF) 250 MCG/5ML IJ SOLN
INTRAMUSCULAR | Status: DC | PRN
  Administered 2021-05-12: 100 ug via INTRAVENOUS
  Administered 2021-05-12 (×6): 50 ug via INTRAVENOUS

## 2021-05-12 MED ORDER — ACETAMINOPHEN 500 MG PO TABS
1000.0000 mg | ORAL_TABLET | ORAL | Status: AC
  Administered 2021-05-12: 1000 mg via ORAL
  Filled 2021-05-12: qty 2

## 2021-05-12 MED ORDER — PANTOPRAZOLE SODIUM 40 MG IV SOLR
40.0000 mg | Freq: Every day | INTRAVENOUS | Status: DC
  Administered 2021-05-12 – 2021-05-13 (×2): 40 mg via INTRAVENOUS
  Filled 2021-05-12 (×2): qty 40

## 2021-05-12 MED ORDER — SODIUM CHLORIDE 0.9 % IV SOLN
12.5000 mg | Freq: Four times a day (QID) | INTRAVENOUS | Status: DC | PRN
  Filled 2021-05-12: qty 0.5

## 2021-05-12 MED ORDER — ONDANSETRON HCL 4 MG/2ML IJ SOLN
4.0000 mg | INTRAMUSCULAR | Status: DC | PRN
  Administered 2021-05-12: 4 mg via INTRAVENOUS
  Filled 2021-05-12: qty 2

## 2021-05-12 MED ORDER — KCL IN DEXTROSE-NACL 20-5-0.45 MEQ/L-%-% IV SOLN
INTRAVENOUS | Status: DC
  Filled 2021-05-12 (×4): qty 1000

## 2021-05-12 MED ORDER — KETAMINE HCL 10 MG/ML IJ SOLN
INTRAMUSCULAR | Status: AC
  Filled 2021-05-12: qty 1

## 2021-05-12 MED ORDER — ROCURONIUM BROMIDE 10 MG/ML (PF) SYRINGE
PREFILLED_SYRINGE | INTRAVENOUS | Status: DC | PRN
Start: 2021-05-12 — End: 2021-05-12
  Administered 2021-05-12: 60 mg via INTRAVENOUS

## 2021-05-12 SURGICAL SUPPLY — 68 items
ADH SKN CLS APL DERMABOND .7 (GAUZE/BANDAGES/DRESSINGS) ×2
APL PRP STRL LF DISP 70% ISPRP (MISCELLANEOUS) ×2
APPLIER CLIP 5 13 M/L LIGAMAX5 (MISCELLANEOUS)
APPLIER CLIP ROT 10 11.4 M/L (STAPLE)
APR CLP MED LRG 11.4X10 (STAPLE)
APR CLP MED LRG 5 ANG JAW (MISCELLANEOUS)
BLADE SURG 15 STRL LF DISP TIS (BLADE) ×2 IMPLANT
BLADE SURG 15 STRL SS (BLADE) ×3
CANNULA REDUC XI 12-8 STAPL (CANNULA) ×3
CANNULA REDUCER 12-8 DVNC XI (CANNULA) ×2 IMPLANT
CHLORAPREP W/TINT 26 (MISCELLANEOUS) ×3 IMPLANT
CLIP APPLIE 5 13 M/L LIGAMAX5 (MISCELLANEOUS) IMPLANT
CLIP APPLIE ROT 10 11.4 M/L (STAPLE) IMPLANT
COVER SURGICAL LIGHT HANDLE (MISCELLANEOUS) ×3 IMPLANT
DECANTER SPIKE VIAL GLASS SM (MISCELLANEOUS) ×2 IMPLANT
DERMABOND ADVANCED (GAUZE/BANDAGES/DRESSINGS) ×1
DERMABOND ADVANCED .7 DNX12 (GAUZE/BANDAGES/DRESSINGS) ×2 IMPLANT
DRAPE ARM DVNC X/XI (DISPOSABLE) ×8 IMPLANT
DRAPE COLUMN DVNC XI (DISPOSABLE) ×2 IMPLANT
DRAPE DA VINCI XI ARM (DISPOSABLE) ×12
DRAPE DA VINCI XI COLUMN (DISPOSABLE) ×3
ELECT REM PT RETURN 15FT ADLT (MISCELLANEOUS) ×3 IMPLANT
GLOVE SURG ENC MOIS LTX SZ8 (GLOVE) ×6 IMPLANT
GOWN STRL REUS W/TWL XL LVL3 (GOWN DISPOSABLE) ×9 IMPLANT
GRASPER SUT TROCAR 14GX15 (MISCELLANEOUS) ×3 IMPLANT
IRRIG SUCT STRYKERFLOW 2 WTIP (MISCELLANEOUS) ×3
IRRIGATION SUCT STRKRFLW 2 WTP (MISCELLANEOUS) ×2 IMPLANT
KIT BASIN OR (CUSTOM PROCEDURE TRAY) ×3 IMPLANT
LUBRICANT JELLY K Y 4OZ (MISCELLANEOUS) IMPLANT
MARKER SKIN DUAL TIP RULER LAB (MISCELLANEOUS) ×3 IMPLANT
NDL SPNL 22GX3.5 QUINCKE BK (NEEDLE) ×1 IMPLANT
NEEDLE SPNL 22GX3.5 QUINCKE BK (NEEDLE) ×3 IMPLANT
OBTURATOR OPTICAL STANDARD 8MM (TROCAR) ×3
OBTURATOR OPTICAL STND 8 DVNC (TROCAR) ×2
OBTURATOR OPTICALSTD 8 DVNC (TROCAR) ×2 IMPLANT
PACK CARDIOVASCULAR III (CUSTOM PROCEDURE TRAY) ×3 IMPLANT
PAD POSITIONING PINK XL (MISCELLANEOUS) ×3 IMPLANT
RELOAD STAPLE 60 2.5 WHT DVNC (STAPLE) IMPLANT
RELOAD STAPLE 60 3.5 BLU DVNC (STAPLE) IMPLANT
RELOAD STAPLER 2.5X60 WHT DVNC (STAPLE) ×8 IMPLANT
RELOAD STAPLER 3.5X60 BLU DVNC (STAPLE) ×2 IMPLANT
SCISSORS LAP 5X35 DISP (ENDOMECHANICALS) IMPLANT
SEAL CANN UNIV 5-8 DVNC XI (MISCELLANEOUS) ×6 IMPLANT
SEAL XI 5MM-8MM UNIVERSAL (MISCELLANEOUS) ×9
SEALER VESSEL DA VINCI XI (MISCELLANEOUS) ×3
SEALER VESSEL EXT DVNC XI (MISCELLANEOUS) ×2 IMPLANT
SLEEVE GASTRECTOMY 36FR VISIGI (MISCELLANEOUS) ×3 IMPLANT
SLEEVE GASTRECTOMY 40FR VISIGI (MISCELLANEOUS) IMPLANT
SOL ANTI FOG 6CC (MISCELLANEOUS) ×2 IMPLANT
SOLUTION ANTI FOG 6CC (MISCELLANEOUS) ×1
SOLUTION ELECTROLUBE (MISCELLANEOUS) ×3 IMPLANT
STAPLER 60 DA VINCI SURE FORM (STAPLE) ×3
STAPLER 60 SUREFORM DVNC (STAPLE) ×2 IMPLANT
STAPLER CANNULA SEAL DVNC XI (STAPLE) ×2 IMPLANT
STAPLER CANNULA SEAL XI (STAPLE) ×3
STAPLER RELOAD 2.5X60 WHITE (STAPLE) ×12
STAPLER RELOAD 2.5X60 WHT DVNC (STAPLE) ×8
STAPLER RELOAD 3.5X60 BLU DVNC (STAPLE) ×2
STAPLER RELOAD 3.5X60 BLUE (STAPLE) ×3
SUT MNCRL AB 4-0 PS2 18 (SUTURE) ×6 IMPLANT
SYR 10ML ECCENTRIC (SYRINGE) IMPLANT
SYR 20ML LL LF (SYRINGE) ×3 IMPLANT
TOWEL OR 17X26 10 PK STRL BLUE (TOWEL DISPOSABLE) ×3 IMPLANT
TRAY FOLEY MTR SLVR 16FR STAT (SET/KITS/TRAYS/PACK) IMPLANT
TROCAR ADV FIXATION 5X100MM (TROCAR) IMPLANT
TROCAR BLADELESS OPT 5 100 (ENDOMECHANICALS) ×3 IMPLANT
TUBE CALIBRATION LAPBAND (TUBING) ×3 IMPLANT
TUBING INSUFFLATION 10FT LAP (TUBING) ×3 IMPLANT

## 2021-05-12 NOTE — Discharge Instructions (Signed)

## 2021-05-12 NOTE — Interval H&P Note (Signed)
History and Physical Interval Note:  05/12/2021 7:24 AM  Christina Cox  has presented today for surgery, with the diagnosis of MORBID OBESITY.  The various methods of treatment have been discussed with the patient and family. After consideration of risks, benefits and other options for treatment, the patient has consented to  Procedure(s): XI ROBOTIC GASTRIC SLEEVE RESECTION (N/A) UPPER GI ENDOSCOPY (N/A) as a surgical intervention.  The patient's history has been reviewed, patient examined, no change in status, stable for surgery.  I have reviewed the patient's chart and labs.  Questions were answered to the patient's satisfaction.     Valarie Merino

## 2021-05-12 NOTE — Progress Notes (Addendum)
Patient has started water. Incentive spirometer use demonstrated. Patient is frequently walking to bathroom but wishes to rest in chair before walking the halls. She c/o nausea and 7/10 abdominal pain. PRN given. Patient is tolerating pills and water. Stable.

## 2021-05-12 NOTE — Progress Notes (Signed)
PHARMACY CONSULT FOR:  Risk Assessment for Post-Discharge VTE Following Bariatric Surgery  Post-Discharge VTE Risk Assessment: This patient's probability of 30-day post-discharge VTE is increased due to the factors marked:   Female    Age >/=60 years    BMI >/=50 kg/m2    CHF    Dyspnea at Rest    Paraplegia   x Non-gastric-band surgery    Operation Time >/=3 hr    Return to OR     Length of Stay >/= 3 d   Hx of VTE   Hypercoagulable condition   Significant venous stasis       Predicted probability of 30-day post-discharge VTE:  - 0.16%  Other patient-specific factors to consider:  - No noted history of VTE or anticoagulation    Recommendation for Discharge: No pharmacologic prophylaxis post-discharge        Christina Cox is a 40 y.o. female who underwent  Robotic sleeve gastrectomy and upper endoscopy on 05/12/2021   Case start: 0807 Case end: 0940   Allergies  Allergen Reactions   Codeine Itching    Patient Measurements: Height: 5\' 3"  (160 cm) Weight: 95.4 kg (210 lb 6.4 oz) IBW/kg (Calculated) : 52.4 Body mass index is 37.27 kg/m.  No results for input(s): WBC, HGB, HCT, PLT, APTT, CREATININE, LABCREA, CREATININE, CREAT24HRUR, MG, PHOS, ALBUMIN, PROT, ALBUMIN, AST, ALT, ALKPHOS, BILITOT, BILIDIR, IBILI in the last 72 hours. Estimated Creatinine Clearance: 100.7 mL/min (by C-G formula based on SCr of 0.73 mg/dL).    Past Medical History:  Diagnosis Date   Anxiety    Back pain    Chest pain    Chronic migraine    Constipation    Headache    Hypertension    IBS (irritable bowel syndrome)    Interstitial cystitis    Knee pain    Stiff neck    Swelling of left lower extremity    Swelling of lower extremity    Trouble in sleeping    Wears glasses      Medications Prior to Admission  Medication Sig Dispense Refill Last Dose   azelastine (OPTIVAR) 0.05 % ophthalmic solution Place 1 drop into both eyes 2 (two) times daily. (Patient taking  differently: Place 1 drop into both eyes daily.) 6 mL 1 Past Week   methocarbamol (ROBAXIN) 500 MG tablet Take 1 tablet (500 mg total) by mouth every 8 (eight) hours as needed for muscle spasms. 30 tablet 0 Past Week   topiramate (TOPAMAX) 100 MG tablet Take 1 tablet (100 mg total) by mouth 2 (two) times daily. (Patient taking differently: Take 100 mg by mouth 2 (two) times daily as needed (migraine).) 30 tablet 0 Past Month   verapamil (VERELAN PM) 180 MG 24 hr capsule Take 180 mg by mouth daily as needed (when directed by MD).   05/09/2021   lamoTRIgine (LAMICTAL) 100 MG tablet Take 1 tablet (100 mg total) by mouth every evening. (Patient not taking: No sig reported) 30 tablet 0 Not Taking   lurasidone (LATUDA) 20 MG TABS tablet Take 1 tablet (20 mg total) by mouth daily with supper. For mood control (Patient not taking: No sig reported) 30 tablet 0 Not Taking   metFORMIN (GLUCOPHAGE) 500 MG tablet Take 1 tablet (500 mg total) by mouth 2 (two) times daily with a meal. (Patient not taking: No sig reported) 60 tablet 0 Not Taking   Vitamin D, Ergocalciferol, (DRISDOL) 1.25 MG (50000 UT) CAPS capsule TAKE 1 CAPSULE BY MOUTH EVERY 7 DAYS (  Patient not taking: No sig reported) 4 capsule 0 Not Taking       [name], PharmD, BCPS 05/12/2021 1:33 PM

## 2021-05-12 NOTE — Transfer of Care (Signed)
Immediate Anesthesia Transfer of Care Note  Patient: Christina Cox  Procedure(s) Performed: XI ROBOTIC GASTRIC SLEEVE RESECTION (Abdomen) UPPER GI ENDOSCOPY (Esophagus)  Patient Location: PACU  Anesthesia Type:General  Level of Consciousness: awake, alert , oriented and patient cooperative  Airway & Oxygen Therapy: Patient Spontanous Breathing and Patient connected to face mask oxygen  Post-op Assessment: Report given to RN, Post -op Vital signs reviewed and stable and Patient moving all extremities  Post vital signs: Reviewed and stable  Last Vitals:  Vitals Value Taken Time  BP 160/97 05/12/21 0950  Temp    Pulse 97 05/12/21 0951  Resp 14 05/12/21 0951  SpO2 100 % 05/12/21 0951  Vitals shown include unvalidated device data.  Last Pain:  Vitals:   05/12/21 0626  TempSrc:   PainSc: 0-No pain      Patients Stated Pain Goal: 3 (05/12/21 4628)  Complications: No notable events documented.

## 2021-05-12 NOTE — Anesthesia Postprocedure Evaluation (Signed)
Anesthesia Post Note  Patient: Christina Cox  Procedure(s) Performed: XI ROBOTIC GASTRIC SLEEVE RESECTION (Abdomen) UPPER GI ENDOSCOPY (Esophagus)     Patient location during evaluation: PACU Anesthesia Type: General Level of consciousness: sedated and patient cooperative Pain management: pain level controlled Vital Signs Assessment: post-procedure vital signs reviewed and stable Respiratory status: spontaneous breathing Cardiovascular status: stable Anesthetic complications: no   No notable events documented.  Last Vitals:  Vitals:   05/12/21 1438 05/12/21 1807  BP: (!) 155/88 140/80  Pulse: 80 77  Resp: 17 18  Temp: 36.7 C 37 C  SpO2: 98% 99%    Last Pain:  Vitals:   05/12/21 1936  TempSrc:   PainSc: 7                  Lewie Loron

## 2021-05-12 NOTE — Progress Notes (Signed)

## 2021-05-12 NOTE — Anesthesia Procedure Notes (Signed)

## 2021-05-12 NOTE — Op Note (Signed)
05/12/2021   Surgeon: Wenda Low, MD, FACS  Asst:  Phylliss Blakes, MD, FACS  Anes:  General endotracheal  Procedure: Robotic sleeve gastrectomy and upper endoscopy  Diagnosis: Morbid obesity  Complications: None noted  EBL:   minimal cc  Description of Procedure:  The patient was take to OR 2 and given general anesthesia.  The abdomen was prepped with Chloroprep and draped sterilely.  A timeout was performed.  Access to the abdomen was achieved with a 5 mm Optiview in the left upper quadrant.  Following insufflation, the state of the abdomen was found to be free of adhesions.  The ViSiGi 36Fr tube was inserted to deflate the stomach and was pulled back into the esophagus.  Four trocars were placed including a 12 mm for the robotic stapler.    The pylorus was identified and we measured 5 cm back and marked the antrum.  At that point we began dissection to take down the greater curvature of the stomach using the vessel sealer.  This dissection was taken all the way up to the left crus.  Posterior attachments of the stomach were also taken down.    The ViSiGi tube was then passed into the antrum and suction applied so that it was snug along the lessor curvature.  The "crow's foot" or incisura was identified.  The sleeve gastrectomy was begun using the Sureform platform stapler beginning with a blue load followed by white loads.  When the sleeve was complete the tube was taken off suction and insufflated briefly.  The tube was withdrawn.  Upper endoscopy was then performed by Dr. Daphine Deutscher.  This showed a cylindrical sleeve and not bleeding or bubbles.     The specimen was extracted through the 15 trocar site.  Local block was provided by infiltrating abdomen as a TAP block and then closed 4-0 Monocryl and Dermabond.    Matt B. Daphine Deutscher, MD, [facility], [place] [phone]

## 2021-05-13 ENCOUNTER — Encounter (HOSPITAL_COMMUNITY): Payer: Self-pay | Admitting: Surgery

## 2021-05-13 ENCOUNTER — Other Ambulatory Visit (HOSPITAL_COMMUNITY): Payer: Self-pay

## 2021-05-13 LAB — CBC WITH DIFFERENTIAL/PLATELET
Abs Immature Granulocytes: 0.08 10*3/uL — ABNORMAL HIGH (ref 0.00–0.07)
Basophils Absolute: 0 10*3/uL (ref 0.0–0.1)
Basophils Relative: 0 %
Eosinophils Absolute: 0 10*3/uL (ref 0.0–0.5)
Eosinophils Relative: 0 %
HCT: 43.4 % (ref 36.0–46.0)
Hemoglobin: 14.1 g/dL (ref 12.0–15.0)
Immature Granulocytes: 1 %
Lymphocytes Relative: 10 %
Lymphs Abs: 1.7 10*3/uL (ref 0.7–4.0)
MCH: 29.1 pg (ref 26.0–34.0)
MCHC: 32.5 g/dL (ref 30.0–36.0)
MCV: 89.7 fL (ref 80.0–100.0)
Monocytes Absolute: 0.9 10*3/uL (ref 0.1–1.0)
Monocytes Relative: 5 %
Neutro Abs: 13.5 10*3/uL — ABNORMAL HIGH (ref 1.7–7.7)
Neutrophils Relative %: 84 %
Platelets: 332 10*3/uL (ref 150–400)
RBC: 4.84 MIL/uL (ref 3.87–5.11)
RDW: 12.6 % (ref 11.5–15.5)
WBC: 16.1 10*3/uL — ABNORMAL HIGH (ref 4.0–10.5)
nRBC: 0 % (ref 0.0–0.2)

## 2021-05-13 MED ORDER — SIMETHICONE 80 MG PO CHEW
80.0000 mg | CHEWABLE_TABLET | Freq: Four times a day (QID) | ORAL | Status: DC | PRN
  Administered 2021-05-13: 80 mg via ORAL
  Filled 2021-05-13: qty 1

## 2021-05-13 MED ORDER — PANTOPRAZOLE SODIUM 40 MG PO TBEC
40.0000 mg | DELAYED_RELEASE_TABLET | Freq: Every day | ORAL | 0 refills | Status: DC
  Filled 2021-05-13: qty 90, 90d supply, fill #0

## 2021-05-13 MED ORDER — OXYCODONE HCL 5 MG PO TABS
5.0000 mg | ORAL_TABLET | Freq: Four times a day (QID) | ORAL | 0 refills | Status: DC | PRN
  Filled 2021-05-13: qty 10, 3d supply, fill #0

## 2021-05-13 MED ORDER — ONDANSETRON 4 MG PO TBDP
4.0000 mg | ORAL_TABLET | Freq: Four times a day (QID) | ORAL | 0 refills | Status: DC | PRN
  Filled 2021-05-13: qty 20, 5d supply, fill #0

## 2021-05-13 MED ORDER — DIPHENHYDRAMINE HCL 50 MG/ML IJ SOLN
25.0000 mg | Freq: Four times a day (QID) | INTRAMUSCULAR | Status: DC | PRN
  Administered 2021-05-13: 25 mg via INTRAVENOUS
  Filled 2021-05-13: qty 1

## 2021-05-13 NOTE — Discharge Summary (Addendum)
Physician Discharge Summary  Patient ID: Christina Cox MRN: [id number] DOB/AGE: 1980/11/05 40 y.o.  PCP: Kaleen Mask, MD  Admit date: 05/12/2021 Discharge date: 05/14/2021  Admission Diagnoses:  obesity  Discharge Diagnoses:  same  Active Problems:   Status post sleeve gastrectomy   Surgery:  robotic XI sleeve gastrectomy  Discharged Condition: improved   Hospital Course:   Had sleeve on Monday.  Was begun on liquids and not ready for discharge on Tuesday.  Not drinking enough.  This improved and she was able to go home on Wednesday.  Her moring CBC looked good.    Consults: none  Significant Diagnostic Studies: none    Discharge Exam: Blood pressure 131/75, pulse 61, temperature 97.9 F (36.6 C), temperature source Oral, resp. rate 18, height 5\' 3"  (1.6 m), weight 95.4 kg, last menstrual period 05/04/2021, SpO2 97 %. Incisions OK.  She was seen by me on the morning of discharge and she had already gotten her medications.   Disposition: Discharge disposition: 01-Home or Self Care       Discharge Instructions     Ambulate hourly while awake   Complete by: As directed    Call MD for:  difficulty breathing, headache or visual disturbances   Complete by: As directed    Call MD for:  persistant dizziness or light-headedness   Complete by: As directed    Call MD for:  persistant nausea and vomiting   Complete by: As directed    Call MD for:  redness, tenderness, or signs of infection (pain, swelling, redness, odor or green/yellow discharge around incision site)   Complete by: As directed    Call MD for:  severe uncontrolled pain   Complete by: As directed    Call MD for:  temperature >101 F   Complete by: As directed    Diet bariatric full liquid   Complete by: As directed    Incentive spirometry   Complete by: As directed    Perform hourly while awake      Allergies as of 05/13/2021       Reactions   Codeine Itching        Medication List      STOP taking these medications    lamoTRIgine 100 MG tablet Commonly known as: LAMICTAL       TAKE these medications    methocarbamol 500 MG tablet Commonly known as: Robaxin Take 1 tablet (500 mg total) by mouth every 8 (eight) hours as needed for muscle spasms.   ondansetron 4 MG disintegrating tablet Commonly known as: ZOFRAN-ODT Take 1 tablet (4 mg total) by mouth every 6 (six) hours as needed for nausea or vomiting.   oxyCODONE 5 MG immediate release tablet Commonly known as: Oxy IR/ROXICODONE Take 1 tablet (5 mg total) by mouth every 6 (six) hours as needed for severe pain.   pantoprazole 40 MG tablet Commonly known as: PROTONIX Take 1 tablet (40 mg total) by mouth daily.   verapamil 180 MG 24 hr capsule Commonly known as: VERELAN PM Take 180 mg by mouth daily as needed (when directed by MD).       ASK your doctor about these medications    azelastine 0.05 % ophthalmic solution Commonly known as: OPTIVAR Place 1 drop into both eyes 2 (two) times daily.   lurasidone 20 MG Tabs tablet Commonly known as: LATUDA Take 1 tablet (20 mg total) by mouth daily with supper. For mood control   metFORMIN 500 MG tablet Commonly known  as: GLUCOPHAGE Take 1 tablet (500 mg total) by mouth 2 (two) times daily with a meal.   topiramate 100 MG tablet Commonly known as: TOPAMAX Take 1 tablet (100 mg total) by mouth 2 (two) times daily.   Vitamin D (Ergocalciferol) 1.25 MG (50000 UNIT) Caps capsule Commonly known as: DRISDOL TAKE 1 CAPSULE BY MOUTH EVERY 7 DAYS        Follow-up Information     Sur[facility], [facility]. Go on 06/04/2021.   Specialty: General Surgery Why: at 9am with Dr. Daphine Deutscher.  Please arrive 15 minutes prior to your appointment time. Thank you. Contact information: [address] [place] [address] [phone]         Hedda Slade, PA-C. Go on 07/04/2021.   Specialty: General Surgery Why: at 9:45am for Dr. Daphine Deutscher.  Please  arrive 15 minutes prior to your appointment time.Thank you. Contact information: [address] [place] [address] [phone]                 Signed: Valarie Merino 05/13/2021, 9:58 AM

## 2021-05-13 NOTE — Progress Notes (Signed)
Patient alert and oriented, working on pain control. Patient is tolerating fluids, patient to advance to protein shake today. Reviewed Gastric sleeve discharge instructions with patient and patient is able to articulate understanding.  Provided information on BELT program, Support Group and [facility] outpatient pharmacy.  Patient to ambulate in hall by 1500 and transition to protein. All questions answered, will continue to monitor.

## 2021-05-13 NOTE — Progress Notes (Signed)
Nutrition Education Note ° °Received consult for diet education for patient s/p bariatric surgery. ° °Discussed 2 week post op diet with pt. Emphasized that liquids must be non carbonated, non caffeinated, and sugar free. Fluid goals discussed. Pt to follow up with outpatient bariatric RD for further diet progression after 2 weeks. Multivitamins and minerals also reviewed. Teach back method used, pt expressed understanding, expect good compliance. ° °If nutrition issues arise, please consult RD. ° °Christina Brau, MS, RD, LDN °Inpatient Clinical Dietitian °Contact information available via Amion ° ° °

## 2021-05-13 NOTE — Progress Notes (Signed)
Patient alert and oriented, Post op day 1.  Provided support and encouragement.  Encouraged pulmonary toilet, ambulation in the hall by 1230 and small sips of liquids.  All questions answered.  Will continue to monitor.

## 2021-05-13 NOTE — Progress Notes (Signed)
MD Cornett was paged because patient states back is itchy and she has some redness.

## 2021-05-14 ENCOUNTER — Other Ambulatory Visit (HOSPITAL_COMMUNITY): Payer: Self-pay

## 2021-05-14 LAB — CBC WITH DIFFERENTIAL/PLATELET
Abs Immature Granulocytes: 0.04 10*3/uL (ref 0.00–0.07)
Basophils Absolute: 0 10*3/uL (ref 0.0–0.1)
Basophils Relative: 0 %
Eosinophils Absolute: 0.1 10*3/uL (ref 0.0–0.5)
Eosinophils Relative: 1 %
HCT: 43.3 % (ref 36.0–46.0)
Hemoglobin: 13.7 g/dL (ref 12.0–15.0)
Immature Granulocytes: 0 %
Lymphocytes Relative: 33 %
Lymphs Abs: 3.6 10*3/uL (ref 0.7–4.0)
MCH: 28.9 pg (ref 26.0–34.0)
MCHC: 31.6 g/dL (ref 30.0–36.0)
MCV: 91.4 fL (ref 80.0–100.0)
Monocytes Absolute: 0.7 10*3/uL (ref 0.1–1.0)
Monocytes Relative: 6 %
Neutro Abs: 6.3 10*3/uL (ref 1.7–7.7)
Neutrophils Relative %: 60 %
Platelets: 317 10*3/uL (ref 150–400)
RBC: 4.74 MIL/uL (ref 3.87–5.11)
RDW: 13.1 % (ref 11.5–15.5)
WBC: 10.8 10*3/uL — ABNORMAL HIGH (ref 4.0–10.5)
nRBC: 0 % (ref 0.0–0.2)

## 2021-05-14 NOTE — Progress Notes (Signed)
Patient alert and oriented, Post op day 2.  Provided support and encouragement.  Encouraged pulmonary toilet, ambulation and small sips of liquids.  All questions answered.  Will continue to monitor. Plan to discharge later today when family is able to pick up patient.

## 2021-05-14 NOTE — Progress Notes (Signed)
Pt dcd home today

## 2021-05-14 NOTE — Progress Notes (Signed)
24hr fluid recall prior to discharge: .  Per dehydration protocol, will call pt to f/u within one week post op.

## 2021-05-16 ENCOUNTER — Telehealth (HOSPITAL_COMMUNITY): Payer: Self-pay | Admitting: *Deleted

## 2021-05-16 ENCOUNTER — Telehealth: Payer: Self-pay | Admitting: Dietician

## 2021-05-16 NOTE — Telephone Encounter (Signed)
1.  Tell me about your pain and pain management? Pt denies any pain.  2.  Let's talk about fluid intake.  How much total fluid are you taking in? Pt states that she is working to meet goal of 64 oz of fluid today.  Pt has consumed 1/2  protein shake, Gatorade, Propel since being discharged averaging 51-55 fl oz.  Pt encouraged to prioritize protein and continue to work towards meeting goal.  Pt instructed to assess status and suggestions daily utilizing Hydration Action Plan on discharge folder and to call [facility] if in the "red zone".   3.  How much protein have you taken in the last 2 days? Pt states that she is working to meet goal of goal of 60g of protein today.  Pt has already consumed 1/2 protein shake.  Pt plans to drink remainder of protein throughout the rest of the day to meet goal.  4.  Have you had nausea?  Tell me about when have experienced nausea and what you did to help? Pt denies nausea.   5.  Has the frequency or color changed with your urine? Pt states that she is urinating "fine" with no changes in frequency or urgency.     6.  Tell me what your incisions look like? "Incisions look fine, some of the glue has come off". Pt denies a fever, chills.  Pt states incisions are not swollen, open, or draining.  Pt encouraged to call [facility] if incisions change.   7.  Have you been passing gas? BM? Pt states that she has not had a BM.  Pt instructed to take either Miralax or MoM as instructed per "Gastric Bypass/Sleeve Discharge Home Care Instructions".  Pt to call surgeon's office if not able to have BM with medication.   8.  If a problem or question were to arise who would you call?  Do you know contact numbers for [facility], [facility], and [facility]? Pt denies dehydration symptoms.  Pt can describe s/sx of dehydration.  Pt knows to call [facility] for surgical, [facility] for nutrition, and [facility] for non-urgent questions or concerns.   9.  How has the walking going? Pt states she is walking around and able to be  active without difficulty.   10. Are you still using your incentive spirometer?  If so, how often? Pt states that she is doing I.S. once a day.  Pt encouraged to use incentive spirometer, at least 10x every hour while awake until s/he sees the surgeon.  11.  How are your vitamins and calcium going?  How are you taking them? Pt states that she is taking her supplements and vitamins without difficulty.  Reminded patient that the first 30 days post-operatively are important for successful recovery.  Practice good hand hygiene, wearing a mask when appropriate (since optional in most places), and minimizing exposure to people who live outside of the home, especially if they are exhibiting any respiratory, GI, or illness-like symptoms.

## 2021-05-16 NOTE — Telephone Encounter (Signed)
Returned voicemail message from patient to schedule 2 week post-op MNT. Scheduled for 05/26/21 at 11:45am

## 2021-05-20 ENCOUNTER — Telehealth (HOSPITAL_COMMUNITY): Payer: Self-pay | Admitting: *Deleted

## 2021-05-20 LAB — SURGICAL PATHOLOGY

## 2021-05-26 ENCOUNTER — Other Ambulatory Visit: Payer: Self-pay

## 2021-05-26 ENCOUNTER — Encounter: Payer: Self-pay | Admitting: Dietician

## 2021-05-26 ENCOUNTER — Encounter: Payer: Medicare Other | Attending: Surgery | Admitting: Dietician

## 2021-05-26 VITALS — Ht 63.0 in | Wt 196.0 lb

## 2021-05-26 DIAGNOSIS — E669 Obesity, unspecified: Secondary | ICD-10-CM

## 2021-05-26 NOTE — Patient Instructions (Addendum)
If unable to tolerate the bariatric multivitamins, try Flintstones Complete, take 4 each day. Work up from 2 daily to 4 gradually if needed. Check into using the baritastic app to track intake and help monitor protein intake, calories, etc to ensure adequate intake by the end of the day. Try making a homemade protein shake with unflavored protein powder. Check out recipes on baritastic or other online resources. Make sure recipes are low in carbs and fat.

## 2021-05-26 NOTE — Progress Notes (Signed)
Nutrition Therapy for Post-Operative Bariatric Diet Follow-up visit:  2 weeks post-op sleeve gastrectomy Surgery  Medical Nutrition Therapy:  Appt start time: 1150 end time:  1220  Anthropometrics: InBody measurement:  Date 05/26/21  BMI 34.72  Weight (lbs) 196  Skeletal muscle (lbs) 51.6  % body fat 52.1   Clinical: Medications: not taking any medications at this time Supplementation: stopped bariatric multivitamin 3 days ago due to nausea and vomiting when taking; is taking calcium supplement 3x daily Health/ medical history changes: no changes GI symptoms: N/V/D/C: N/V from mvi taste and odor, also from protein shakes Dumping Syndrome: one episode after eating 1 vanilla wafer Hair loss: none significant  Dietary/ Lifestyle Progress: Patient reports protein shakes have caused nausea and some vomiting; bariatric multivitamins have also caused these symptoms, so she has stopped both. She has been eating some Lean Cuisine meals, able to take about 4-5 bites before feeling full.  Has not been eating at regular intervals, has been following more grazing pattern eating when feeling hungry.  Dietary recall: Eating pattern: grazing pattern Foods eaten: [place] yogurt, Lean Cuisine broccoli with chicken and pasta or l pizza 4-5 bites then hungry; some sugar free jello, sf popsicles, cream soup Dining out: none  Beverages: Crystal light peach tea, vitamin water lemonade zero sugar Fluid intake: 32oz daily, unable to drink more yet Estimated total protein intake: about 20g daily  Bariatric diet adherence:  Using straws: yes, seems to help with drinking more per patient Drinking fluids during meals: no Carbonated beverages: no  Recent physical activity:  some light walking   Nutrition Intervention:   Reviewed progress since surgery Discussed importance of focus on protein intake; daily protein goal, options for protein sources Advised avoiding other food groups until able to reach  protein goal consistently, then adding low carb vegetables rather than starches or fruits. Discussed benefits of tracking food intake and options for tracking.  Discussed suitable alternatives to bariatric vitamins; need for ongoing supplementation.  Nutritional Diagnosis:  [place]-3.3 Overweight/obesity As related to history of excess calories and inadequate physical activity.  As evidenced by patient with current BMI of 34.72, 2 weeks post op sleeve gastrectomy.  Teaching Method Utilized:  Visual Auditory Hands on  Materials provided: Phase 3 bariatric diet handout  Learning Readiness:  Change in progress  Barriers to learning/adherence to lifestyle change: none  Demonstrated degree of understanding via:  Teach Back      Plan: Return for MNT follow up on 07/14/21 at 10:45am

## 2021-05-27 ENCOUNTER — Ambulatory Visit: Payer: Medicare Other

## 2021-06-04 ENCOUNTER — Other Ambulatory Visit (HOSPITAL_COMMUNITY): Payer: Self-pay

## 2021-06-09 ENCOUNTER — Other Ambulatory Visit (HOSPITAL_COMMUNITY): Payer: Self-pay

## 2021-06-17 ENCOUNTER — Other Ambulatory Visit (HOSPITAL_COMMUNITY): Payer: Self-pay

## 2021-07-01 ENCOUNTER — Other Ambulatory Visit (HOSPITAL_COMMUNITY): Payer: Self-pay

## 2021-07-03 ENCOUNTER — Other Ambulatory Visit (HOSPITAL_COMMUNITY): Payer: Self-pay

## 2021-07-09 ENCOUNTER — Other Ambulatory Visit (HOSPITAL_COMMUNITY): Payer: Self-pay

## 2021-07-14 ENCOUNTER — Ambulatory Visit: Payer: Medicare Other | Admitting: Dietician

## 2021-07-17 ENCOUNTER — Other Ambulatory Visit (HOSPITAL_COMMUNITY): Payer: Self-pay

## 2021-07-18 ENCOUNTER — Emergency Department (HOSPITAL_COMMUNITY)
Admission: EM | Admit: 2021-07-18 | Discharge: 2021-07-19 | Disposition: A | Discharge status: Home / Self Care | Payer: Medicare Other | Attending: Emergency Medicine | Admitting: Emergency Medicine

## 2021-07-18 ENCOUNTER — Encounter (HOSPITAL_COMMUNITY): Payer: Self-pay | Admitting: Emergency Medicine

## 2021-07-18 ENCOUNTER — Other Ambulatory Visit: Payer: Self-pay

## 2021-07-18 DIAGNOSIS — H029 Unspecified disorder of eyelid: Secondary | ICD-10-CM

## 2021-07-18 DIAGNOSIS — Z5321 Procedure and treatment not carried out due to patient leaving prior to being seen by health care provider: Secondary | ICD-10-CM | POA: Diagnosis not present

## 2021-07-18 DIAGNOSIS — H5789 Other specified disorders of eye and adnexa: Secondary | ICD-10-CM | POA: Diagnosis not present

## 2021-07-18 LAB — BASIC METABOLIC PANEL
Anion gap: 8 (ref 5–15)
BUN: 10 mg/dL (ref 6–20)
CO2: 24 mmol/L (ref 22–32)
Calcium: 9 mg/dL (ref 8.9–10.3)
Chloride: 106 mmol/L (ref 98–111)
Creatinine, Ser: 0.84 mg/dL (ref 0.44–1.00)
GFR, Estimated: >60 mL/min (ref >60)
Glucose, Bld: 83 mg/dL (ref 70–99)
Potassium: 3.6 mmol/L (ref 3.5–5.1)
Sodium: 138 mmol/L (ref 135–145)

## 2021-07-18 LAB — CBC WITH DIFFERENTIAL/PLATELET
Abs Immature Granulocytes: 0.04 10*3/uL (ref 0.00–0.07)
Basophils Absolute: 0 10*3/uL (ref 0.0–0.1)
Basophils Relative: 0 %
Eosinophils Absolute: 0.2 10*3/uL (ref 0.0–0.5)
Eosinophils Relative: 2 %
HCT: 44.5 % (ref 36.0–46.0)
Hemoglobin: 14.4 g/dL (ref 12.0–15.0)
Immature Granulocytes: 0 %
Lymphocytes Relative: 33 %
Lymphs Abs: 3.7 10*3/uL (ref 0.7–4.0)
MCH: 29.1 pg (ref 26.0–34.0)
MCHC: 32.4 g/dL (ref 30.0–36.0)
MCV: 90.1 fL (ref 80.0–100.0)
Monocytes Absolute: 0.7 10*3/uL (ref 0.1–1.0)
Monocytes Relative: 6 %
Neutro Abs: 6.5 10*3/uL (ref 1.7–7.7)
Neutrophils Relative %: 59 %
Platelets: 294 10*3/uL (ref 150–400)
RBC: 4.94 MIL/uL (ref 3.87–5.11)
RDW: 12.6 % (ref 11.5–15.5)
WBC: 11.1 10*3/uL — ABNORMAL HIGH (ref 4.0–10.5)
nRBC: 0 % (ref 0.0–0.2)

## 2021-07-18 LAB — I-STAT BETA HCG BLOOD, ED (MC, WL, AP ONLY): I-stat hCG, quantitative: <5 m[IU]/mL (ref <5)

## 2021-07-18 NOTE — ED Provider Notes (Signed)
Emergency Medicine Provider Triage Evaluation Note  Brielynn Sekula , a 40 y.o. female  was evaluated in triage.  Pt complains of drooping eyelid.  Review of Systems  Positive: Blurry vision, drooping eyelid Negative: Fever, diplopia, headache  Physical Exam  BP 140/80 (BP Location: Left Arm)   Pulse 68   Temp 98.6 F (37 C) (Oral)   Resp 18   SpO2 100%  Gen:   Awake, no distress   Resp:  Normal effort  MSK:   Moves extremities without difficulty  Other:  Asymmetry of eyebrows, drooping R eyelid compare to L. No trabismus  Medical Decision Making  Medically screening exam initiated at 8:55 PM.  Appropriate orders placed.  Ayerim Berquist was informed that the remainder of the evaluation will be completed by another provider, this initial triage assessment does not replace that evaluation, and the importance of remaining in the ED until their evaluation is complete.  Pt notice drooping R eyelid ongoing for months, which affects her vision.  Does report hx of migraine and receives botox injections to multiple spots on her forehead and back of the head.  Suspect her sxs may be related to botox injection.  Doubt stroke.     [name] Helper, PA-C 07/18/21 [id number]    Wynetta Fines, MD 07/18/21 [phone]

## 2021-07-18 NOTE — ED Triage Notes (Signed)
Patient reports chronic right eyelid drooping for 6 months , denies recent eye injury , no vision loss or blurred vision .

## 2021-07-18 NOTE — ED Notes (Signed)
Pt stated she was leaving AMA 

## 2021-07-23 ENCOUNTER — Other Ambulatory Visit: Payer: Self-pay

## 2021-07-23 ENCOUNTER — Emergency Department (HOSPITAL_COMMUNITY): Payer: Medicare Other

## 2021-07-23 ENCOUNTER — Encounter (HOSPITAL_COMMUNITY): Payer: Self-pay

## 2021-07-23 ENCOUNTER — Emergency Department (HOSPITAL_COMMUNITY)
Admission: EM | Admit: 2021-07-23 | Discharge: 2021-07-23 | Disposition: A | Discharge status: Home / Self Care | Payer: Medicare Other | Attending: Emergency Medicine | Admitting: Emergency Medicine

## 2021-07-23 DIAGNOSIS — N9489 Other specified conditions associated with female genital organs and menstrual cycle: Secondary | ICD-10-CM | POA: Diagnosis not present

## 2021-07-23 DIAGNOSIS — Z79899 Other long term (current) drug therapy: Secondary | ICD-10-CM | POA: Diagnosis not present

## 2021-07-23 DIAGNOSIS — R1030 Lower abdominal pain, unspecified: Secondary | ICD-10-CM | POA: Diagnosis present

## 2021-07-23 DIAGNOSIS — N200 Calculus of kidney: Secondary | ICD-10-CM | POA: Diagnosis not present

## 2021-07-23 DIAGNOSIS — Z87891 Personal history of nicotine dependence: Secondary | ICD-10-CM | POA: Diagnosis not present

## 2021-07-23 DIAGNOSIS — Z7984 Long term (current) use of oral hypoglycemic drugs: Secondary | ICD-10-CM | POA: Diagnosis not present

## 2021-07-23 DIAGNOSIS — I10 Essential (primary) hypertension: Secondary | ICD-10-CM | POA: Insufficient documentation

## 2021-07-23 LAB — BASIC METABOLIC PANEL
Anion gap: 9 (ref 5–15)
BUN: 9 mg/dL (ref 6–20)
CO2: 26 mmol/L (ref 22–32)
Calcium: 9.6 mg/dL (ref 8.9–10.3)
Chloride: 110 mmol/L (ref 98–111)
Creatinine, Ser: 0.91 mg/dL (ref 0.44–1.00)
GFR, Estimated: >60 mL/min (ref >60)
Glucose, Bld: 95 mg/dL (ref 70–99)
Potassium: 3.3 mmol/L — ABNORMAL LOW (ref 3.5–5.1)
Sodium: 145 mmol/L (ref 135–145)

## 2021-07-23 LAB — CBC WITH DIFFERENTIAL/PLATELET
Abs Immature Granulocytes: 0.05 10*3/uL (ref 0.00–0.07)
Basophils Absolute: 0 10*3/uL (ref 0.0–0.1)
Basophils Relative: 0 %
Eosinophils Absolute: 0.1 10*3/uL (ref 0.0–0.5)
Eosinophils Relative: 1 %
HCT: 46.2 % — ABNORMAL HIGH (ref 36.0–46.0)
Hemoglobin: 15.4 g/dL — ABNORMAL HIGH (ref 12.0–15.0)
Immature Granulocytes: 0 %
Lymphocytes Relative: 14 %
Lymphs Abs: 1.6 10*3/uL (ref 0.7–4.0)
MCH: 29.4 pg (ref 26.0–34.0)
MCHC: 33.3 g/dL (ref 30.0–36.0)
MCV: 88.2 fL (ref 80.0–100.0)
Monocytes Absolute: 0.6 10*3/uL (ref 0.1–1.0)
Monocytes Relative: 5 %
Neutro Abs: 9.5 10*3/uL — ABNORMAL HIGH (ref 1.7–7.7)
Neutrophils Relative %: 80 %
Platelets: 304 10*3/uL (ref 150–400)
RBC: 5.24 MIL/uL — ABNORMAL HIGH (ref 3.87–5.11)
RDW: 12.4 % (ref 11.5–15.5)
WBC: 11.8 10*3/uL — ABNORMAL HIGH (ref 4.0–10.5)
nRBC: 0 % (ref 0.0–0.2)

## 2021-07-23 LAB — URINALYSIS, ROUTINE W REFLEX MICROSCOPIC
Bacteria, UA: NONE SEEN
Bilirubin Urine: NEGATIVE
Glucose, UA: NEGATIVE mg/dL
Ketones, ur: 80 mg/dL — AB
Nitrite: NEGATIVE
Protein, ur: 100 mg/dL — AB
RBC / HPF: >50 RBC/hpf — ABNORMAL HIGH (ref 0–5)
Specific Gravity, Urine: 1.013 (ref 1.005–1.030)
pH: 6 (ref 5.0–8.0)

## 2021-07-23 LAB — I-STAT BETA HCG BLOOD, ED (MC, WL, AP ONLY): I-stat hCG, quantitative: <5 m[IU]/mL (ref <5)

## 2021-07-23 MED ORDER — KETOROLAC TROMETHAMINE 15 MG/ML IJ SOLN
15.0000 mg | Freq: Once | INTRAMUSCULAR | Status: AC
  Administered 2021-07-23: 15 mg via INTRAVENOUS
  Filled 2021-07-23: qty 1

## 2021-07-23 MED ORDER — MORPHINE SULFATE (PF) 4 MG/ML IV SOLN
4.0000 mg | Freq: Once | INTRAVENOUS | Status: AC
  Administered 2021-07-23: 4 mg via INTRAVENOUS
  Filled 2021-07-23: qty 1

## 2021-07-23 MED ORDER — TAMSULOSIN HCL 0.4 MG PO CAPS: 0.4000 mg | ORAL_CAPSULE | Freq: Every day | ORAL | 0 refills | Status: DC

## 2021-07-23 MED ORDER — TAMSULOSIN HCL 0.4 MG PO CAPS
0.4000 mg | ORAL_CAPSULE | Freq: Every day | ORAL | 0 refills | Status: DC
Start: 2021-07-23 — End: 2022-05-22

## 2021-07-23 MED ORDER — LACTATED RINGERS IV BOLUS
1000.0000 mL | Freq: Once | INTRAVENOUS | Status: AC
  Administered 2021-07-23: 1000 mL via INTRAVENOUS

## 2021-07-23 MED ORDER — ONDANSETRON 4 MG PO TBDP: 4.0000 mg | ORAL_TABLET | Freq: Three times a day (TID) | ORAL | 0 refills | Status: DC | PRN

## 2021-07-23 MED ORDER — ONDANSETRON HCL 4 MG/2ML IJ SOLN
4.0000 mg | Freq: Once | INTRAMUSCULAR | Status: AC
  Administered 2021-07-23: 4 mg via INTRAVENOUS
  Filled 2021-07-23: qty 2

## 2021-07-23 MED ORDER — METOCLOPRAMIDE HCL 5 MG/ML IJ SOLN
10.0000 mg | Freq: Once | INTRAMUSCULAR | Status: AC
  Administered 2021-07-23: 10 mg via INTRAVENOUS
  Filled 2021-07-23: qty 2

## 2021-07-23 NOTE — ED Notes (Signed)
Pt ambulatory without assistance.  

## 2021-07-23 NOTE — ED Notes (Signed)
Pt ambulatory to restroom w/out assistance. Pt given specimen cup for urine sample.

## 2021-07-23 NOTE — Discharge Instructions (Addendum)
You are seen here today for evaluation of left flank pain.  Labs and imaging showed that you have a kidney stone.  It is extremely important you follow-up with urology tomorrow for further evaluation and management of care of your kidney stone.  You have been prescribed tamsulosin and Zofran.  Zofran is an antinausea medication that you can take as needed.  Tamsulosin is a medication we talked about previously that we will dilate your ureter making it easier for the stone to pass.  For pain, you can take ibuprofen 800 mg every 8 hours as needed.  Please make sure to take medication with food as it can irritate your stomach.  As discussed, if you have any fever, increased abdominal pain, nausea uncontrolled by your medication, blood in your urine, or inability to urinate please return to the nearest emergency department.  If you have any new or worsening symptoms, or any concern, please return to the emergency department.  Work note included if needed.

## 2021-07-23 NOTE — ED Triage Notes (Signed)
Pt reports severe left sided flank pain that radiates to right abdomen that began about an hour ago. Pt denies hx of kidney stones.

## 2021-07-23 NOTE — ED Provider Notes (Signed)
[facility]-EMERGENCY DEPT Provider Note   CSN: [id number] Arrival date & time: 07/23/21  1311     History Chief Complaint  Patient presents with   Flank Pain    Christina Cox is a 40 y.o. female presents to the emergency department with left flank pain since 1200 today.  Denies any trauma, fall, or MVC's.  Patient reports she was sitting in urinalysis and when she had sudden pain to the left flank that radiated around to the left lower abdomen.  She describes it as aching and cramping but not sharp.  She denies any fevers, diarrhea, constipation, abnormal or heavy vaginal bleeding, dysuria, vaginal discharge, hematuria.  She reports she feels nauseous and has had multiple episodes of vomiting due to the pain.  Denies any history of kidney stones.  Patient ports she has a history of interstitial cystitis.  Abdominal surgeries include C-section.  She denies any daily medications.  She denies any allergies to any medications.  She denies any smoking, EtOH, or drug use.  Patient is currently on her menstrual cycle.   Flank Pain Associated symptoms include abdominal pain. Pertinent negatives include no chest pain and no shortness of breath.      Past Medical History:  Diagnosis Date   Anxiety    Back pain    Chest pain    Chronic migraine    Constipation    Headache    Hypertension    IBS (irritable bowel syndrome)    Interstitial cystitis    Knee pain    Stiff neck    Swelling of left lower extremity    Swelling of lower extremity    Trouble in sleeping    Wears glasses     Patient Active Problem List   Diagnosis Date Noted   Status post sleeve gastrectomy 05/12/2021   Class 1 obesity with serious comorbidity and body mass index (BMI) of 30.0 to 30.9 in adult 12/29/2018   Insulin resistance 12/08/2018   Vitamin D deficiency 12/08/2018   UTI (urinary tract infection) 10/23/2017   Bipolar I disorder, most recent episode depressed (HCC) 10/20/2017    MDD (major depressive disorder), severe (HCC) 10/20/2017   Advanced maternal age in multigravida 12/25/2016   Migraine without aura 06/13/2013   Depression 06/13/2013   Anxiety 06/13/2013   Insomnia 06/13/2013   Chronic migraine 06/13/2013    Past Surgical History:  Procedure Laterality Date   BREAST ENHANCEMENT SURGERY Bilateral 2012   BREAST SURGERY     implants   BUNIONECTOMY Right age 16   BUNIONECTOMY     CESAREAN SECTION N/A 12/26/2016   Procedure: CESAREAN SECTION;  Surgeon: Philip Aspen, DO;  Location: [facility];  Service: Obstetrics;  Laterality: N/A;   CYSTO WITH HYDRODISTENSION N/A 10/13/2013   Procedure: CYSTOSCOPY/HYDRODISTENSION;  Surgeon: Crist Fat, MD;  Location: [facility];  Service: Urology;  Laterality: N/A;   INTRAUTERINE DEVICE (IUD) INSERTION  2013   mirena   UPPER GI ENDOSCOPY N/A 05/12/2021   Procedure: UPPER GI ENDOSCOPY;  Surgeon: Luretha Murphy, MD;  Location: [facility];  Service: General;  Laterality: N/A;   WISDOM TOOTH EXTRACTION  age 63     OB History     Gravida  2   Para  2   Term  2   Preterm  0   AB  0   Living  2      SAB  0   IAB  0   Ectopic  0  Multiple      Live Births  2           Family History  Problem Relation Age of Onset   Hypertension Father    Heart attack Father    Hyperlipidemia Father    Heart disease Father    Stroke Father    Migraines Mother    Alcohol abuse Mother    Depression Mother    Mental illness Mother    Cancer Mother    Depression Maternal Aunt    Depression Maternal Grandmother    Cancer Maternal Grandmother        lung cancer-smoker    Social History   Tobacco Use   Smoking status: Former    Types: Cigarettes    Quit date: 2009    Years since quitting: 13.7   Smokeless tobacco: Never  Vaping Use   Vaping Use: Never used  Substance Use Topics   Alcohol use: No   Drug use: No    Home Medications Prior to Admission medications   Medication Sig Start  Date End Date Taking? Authorizing Provider  azelastine (OPTIVAR) 0.05 % ophthalmic solution Place 1 drop into both eyes 2 (two) times daily. Patient taking differently: Place 1 drop into both eyes daily. 04/08/21   Particia Nearing, PA-C  lurasidone (LATUDA) 20 MG TABS tablet Take 1 tablet (20 mg total) by mouth daily with supper. For mood control 10/24/17   Money, Gerlene Burdock, FNP  metFORMIN (GLUCOPHAGE) 500 MG tablet Take 1 tablet (500 mg total) by mouth 2 (two) times daily with a meal. 02/21/19   Corinna Capra A, DO  methocarbamol (ROBAXIN) 500 MG tablet Take 1 tablet (500 mg total) by mouth every 8 (eight) hours as needed for muscle spasms. 02/24/21   Rolla Etienne, NP  ondansetron (ZOFRAN ODT) 4 MG disintegrating tablet Take 1 tablet (4 mg total) by mouth every 8 (eight) hours as needed for nausea or vomiting. 07/23/21   Achille Rich, PA-C  pantoprazole (PROTONIX) 40 MG tablet Take 1 tablet (40 mg total) by mouth daily. Patient not taking: Reported on 05/26/2021 05/13/21   Luretha Murphy, MD  tamsulosin [facility]) 0.4 MG CAPS capsule Take 1 capsule (0.4 mg total) by mouth daily. 07/23/21   Achille Rich, PA-C  topiramate (TOPAMAX) 100 MG tablet Take 1 tablet (100 mg total) by mouth 2 (two) times daily. Patient taking differently: Take 100 mg by mouth 2 (two) times daily as needed (migraine). 10/22/17   Oneta Rack, NP  verapamil (VERELAN PM) 180 MG 24 hr capsule Take 180 mg by mouth daily as needed (when directed by MD). 03/13/21   [provider]  Vitamin D, Ergocalciferol, (DRISDOL) 1.25 MG (50000 UT) CAPS capsule TAKE 1 CAPSULE BY MOUTH EVERY 7 DAYS Patient not taking: No sig reported 02/21/19   Corinna Capra A, DO    Allergies    Codeine  Review of Systems   Review of Systems  Constitutional:  Negative for chills, diaphoresis and fever.  HENT:  Negative for ear pain and sore throat.   Eyes:  Negative for pain and visual disturbance.  Respiratory:  Negative for cough and  shortness of breath.   Cardiovascular:  Negative for chest pain and palpitations.  Gastrointestinal:  Positive for abdominal pain, nausea and vomiting. Negative for constipation and diarrhea.  Genitourinary:  Positive for flank pain. Negative for dysuria, hematuria, vaginal bleeding and vaginal discharge.  Musculoskeletal:  Negative for arthralgias and back pain.  Skin:  Negative  for color change and rash.  Neurological:  Negative for seizures and syncope.  All other systems reviewed and are negative.  Physical Exam Updated Vital Signs BP 118/90   Pulse 76   Temp 97.8 F (36.6 C) (Oral)   Resp 18   SpO2 98%   Physical Exam Vitals and nursing note reviewed.  Constitutional:      Appearance: Normal appearance. She is not toxic-appearing.     Comments: Patient appears uncomfortable, and is tearful.  HENT:     Head: Normocephalic and atraumatic.     Mouth/Throat:     Mouth: Mucous membranes are moist.     Pharynx: No posterior oropharyngeal erythema.  Eyes:     General: No scleral icterus. Cardiovascular:     Rate and Rhythm: Normal rate and regular rhythm.  Pulmonary:     Effort: Pulmonary effort is normal. No respiratory distress.     Breath sounds: Normal breath sounds.  Abdominal:     General: Abdomen is flat. Bowel sounds are normal.     Palpations: Abdomen is soft.     Tenderness: There is no abdominal tenderness. There is no right CVA tenderness, left CVA tenderness, guarding or rebound.     Comments: She denies any tenderness to deep or palpation.  No overlying skin changes, erythema, ecchymosis, or rashes noted to the area.  Denies any CVA tenderness.  Normoactive bowel sounds.  Musculoskeletal:        General: No deformity.     Cervical back: Normal range of motion.     Comments: No CVA tenderness.  No back tenderness or flank tenderness.  Patient reports that the pain is deep and is not reducible with touch.  No overlying skin changes, rashes, erythema, or ecchymosis  noted to the area.  No step-offs or deformities  Skin:    General: Skin is warm and dry.  Neurological:     General: No focal deficit present.     Mental Status: She is alert. Mental status is at baseline.    ED Results / Procedures / Treatments   Labs (all labs ordered are listed, but only abnormal results are displayed) Labs Reviewed  URINALYSIS, ROUTINE W REFLEX MICROSCOPIC - Abnormal; Notable for the following components:      Result Value   Color, Urine RED (*)    APPearance CLOUDY (*)    Hgb urine dipstick MODERATE (*)    Ketones, ur 80 (*)    Protein, ur 100 (*)    Leukocytes,Ua TRACE (*)    RBC / HPF >50 (*)    All other components within normal limits  CBC WITH DIFFERENTIAL/PLATELET - Abnormal; Notable for the following components:   WBC 11.8 (*)    RBC 5.24 (*)    Hemoglobin 15.4 (*)    HCT 46.2 (*)    Neutro Abs 9.5 (*)    All other components within normal limits  BASIC METABOLIC PANEL - Abnormal; Notable for the following components:   Potassium 3.3 (*)    All other components within normal limits  I-STAT BETA HCG BLOOD, ED ([facility], [facility], [facility] ONLY)    EKG None  Radiology CT Renal Stone Study  Result Date: 07/23/2021 CLINICAL DATA:  Severe left flank pain for 1 hour EXAM: CT ABDOMEN AND PELVIS WITHOUT CONTRAST TECHNIQUE: Multidetector CT imaging of the abdomen and pelvis was performed following the standard protocol without IV contrast. COMPARISON:  07/09/2008 FINDINGS: Lower chest: No acute pleural or parenchymal lung disease. Hepatobiliary: No focal  liver abnormality is seen. No gallstones, gallbladder wall thickening, or biliary dilatation. Pancreas: Unremarkable. No pancreatic ductal dilatation or surrounding inflammatory changes. Spleen: Normal in size without focal abnormality. Adrenals/Urinary Tract: There is mild left-sided obstructive uropathy due to a 6 mm left UPJ calculus, reference image 37/2. The right kidney is unremarkable. The adrenals and bladder are  grossly normal. Stomach/Bowel: No bowel obstruction or ileus. Normal appendix right lower quadrant. No bowel wall thickening or inflammatory change. Postsurgical changes from previous bariatric surgery. Vascular/Lymphatic: No significant vascular findings are present. No enlarged abdominal or pelvic lymph nodes. Reproductive: Uterus and bilateral adnexa are unremarkable. Other: No free fluid or free gas.  No abdominal wall hernia. Musculoskeletal: No acute or destructive bony lesions. Reconstructed images demonstrate no additional findings. IMPRESSION: 1. Mild left-sided obstructive uropathy due to a 6 mm left UPJ calculus. Electronically Signed   By: Sharlet Salina M.D.   On: 07/23/2021 17:46    Procedures Procedures   Medications Ordered in ED Medications  morphine 4 MG/ML injection 4 mg (4 mg Intravenous Given 07/23/21 1605)  ondansetron (ZOFRAN) injection 4 mg (4 mg Intravenous Given 07/23/21 1605)  lactated ringers bolus 1,000 mL (0 mLs Intravenous Stopped 07/23/21 2116)  morphine 4 MG/ML injection 4 mg (4 mg Intravenous Given 07/23/21 1704)  ketorolac (TORADOL) 15 MG/ML injection 15 mg (15 mg Intravenous Given 07/23/21 1902)  metoCLOPramide (REGLAN) injection 10 mg (10 mg Intravenous Given 07/23/21 1902)    ED Course  I have reviewed the triage vital signs and the nursing notes.  Pertinent labs & imaging results that were available during my care of the patient were reviewed by me and considered in my medical decision making (see chart for details).  [facility]aya [facility] female presenting with left flank pain.  Differential diagnosis includes but is not limited to diverticulitis, renal stone, UTI, viral gastroenteritis.  Patient was given 4 mg of morphine along with Zofran for pain control.  1 L lactated ringer bolus ordered as well.  I personally reviewed and interpreted the patient's labs and imaging.  CT abdomen shows mild left-sided obstructive uropathy due to a 6 mm left UPJ  calculus.  CBC shows mild leukocytosis at 11.8 and increased H&H.  Patient is likely dehydrated and is hemoconcentrated.  BMP shows mildly decreased potassium at 3.3 the patient can be replenished outpatient.  Urinalysis shows red urine with ketones and protein with trace leuks.  On microscopy over 50 white-red blood cells seen, but only 6-10 white blood cells with no bacteria.  This is more likely associated with hematuria with renal stone not UTI.  During her stay in the emergency department, her vital signs remained stable-afebrile, normal rate, normotensive.  No concern for septic stone.  Upon reevaluation, nursing staff alerted me that she is still experiencing pain.  4 mg additional morphine ordered as well as Reglan.  The patient was still complaining of pain, but no longer nausea.  Ordered Toradol.  Abdominal exam unchanged.  Upon reevaluation, patient appears much more comfortable in bed and is lying in bed.  Not tearful.  Reports that her pain is almost gone away.  I discussed with her her lab and imaging findings with the renal stone.  Discussed with her the prescriptions for tamsulosin and Zofran in their use.  Informed her that urology will see her in office tomorrow.  Stressed that following up with urology and taking medications as prescribed is vital for success of getting rid of this renal stone.  Strict return precautions  given.  Patient agrees with plan.  The patient is stable being discharged home in good condition.  I discussed this case with my attending physician who cosigned this note including patient's presenting symptoms, physical exam, and planned diagnostics and interventions. Attending physician stated agreement with plan or made changes to plan which were implemented.   MDM Rules/Calculators/A&P                          Final Clinical Impression(s) / ED Diagnoses Final diagnoses:  Kidney stone    Rx / DC Orders ED Discharge Orders          Ordered    tamsulosin  (FLOMAX) 0.4 MG CAPS capsule  Daily,   Status:  Discontinued        07/23/21 2057    ondansetron (ZOFRAN ODT) 4 MG disintegrating tablet  Every 8 hours PRN,   Status:  Discontinued        07/23/21 2057    ondansetron (ZOFRAN ODT) 4 MG disintegrating tablet  Every 8 hours PRN        07/23/21 2102    tamsulosin (FLOMAX) 0.4 MG CAPS capsule  Daily        07/23/21 2102             Achille Rich, PA-C 07/24/21 0046    Ernie Avena, MD 07/24/21 [phone]

## 2021-08-04 ENCOUNTER — Other Ambulatory Visit: Payer: Self-pay | Admitting: Urology

## 2021-08-04 ENCOUNTER — Encounter: Payer: Self-pay | Admitting: Dietician

## 2021-08-04 DIAGNOSIS — N201 Calculus of ureter: Secondary | ICD-10-CM

## 2021-08-04 NOTE — Progress Notes (Signed)
Have not heard back from patient to reschedule her missed appointment from 07/14/21. Sent notification to referring provider.

## 2021-08-05 NOTE — Progress Notes (Signed)
Patient to arrive at 0800 on 08/07/2021. History and medications reviewed. Pre-procedure instructions given. NPO after MN except for clear liquids until 0600. Driver secured.

## 2021-08-06 NOTE — H&P (Signed)
Office Visit Report     08/01/2021   --------------------------------------------------------------------------------   Christina Cox  MRN: [id number]  DOB: Oct 01, 1981, 40 year old Female  SSN: [id number]   PRIMARY CARE:    REFERRING:  MaryEllen D. Arita Miss, MD  PROVIDER:  Kasandra Knudsen, M.D.  TREATING:  Anne Fu, NP  LOCATION:  [facility] [phone]     --------------------------------------------------------------------------------   CC/HPI: 07/24/2021: Christina Cox was added urgently on the day. She sees Dr. Arita Miss and is on Detrol and diazepam for lower urinary tract symptoms and pelvic pain. She underwent CT scan of the abdomen and pelvis July 23, 2021 which revealed a 6 mm left proximal ureteral stone. The stone is visible on the scout. Skin to stone distance was 11 cm. White count was 11.8 and she has been anywhere from 10.8-16 over the past couple of months. Creatinine 0.9. UA with no bacteria, greater than 50 red cells and 6-10 whites.   No h/o stones. Pain is improved but she was going to take an ibuprofen.   She is planning a funeral for her ex-husband and her son has autism and needs to take care of these matters. She does hair.   08/01/2021: Back today for follow-up exam due to worsening symptomology in the setting of a known left proximal ureteral calculus. Urine culture negative at last office visit. She was prescribed pain medication as well as tamsulosin.   Patient remains symptomatic describing constant, moderately severe left-sided flank pain and discomfort only mildly managed with previously prescribed pain medication. She has been also having intermittent nausea, taking Zofran for this but only with mild control. Not associated with vomiting. She denies fevers or chills. She has not had any increase in lower urinary tract symptoms from baseline. She denies dysuria or gross hematuria.     ALLERGIES: No Allergies    MEDICATIONS: Tamsulosin Hcl  0.4 mg capsule 1 capsule PO Q HS  Tamsulosin Hcl 0.4 mg capsule 1 capsule PO Q HS  Tamsulosin Hcl 0.4 mg capsule  Ibuprofen  Ondansetron Hcl 4 mg tablet 1 tablet PO Q 6 H PRN     GU PSH: Cystoscopy Hydrodistention - 2015       PSH Notes: Cystoscopy With Dilation Of Bladder, Breast Surgery Enlargement Procedure, Foot Surgery   NON-GU PSH: Enlarge Breast - 2014 Foot surgery (unspecified)     GU PMH: Ureteral calculus, She was given ketorolac 30 mg IM today. Felt much better. I discussed with the patient the nature risks and benefits of continued stone passage, off label use of alpha blockers, shockwave lithotripsy or ureteroscopy. All questions answered. Start tamsulosin. See next week for KUB if there is an appt or she can call and update Dr. Arita Miss. I sent in a few pain pills. - 07/24/2021 Stress Incontinence - 09/24/2020 Urinary Urgency - 09/24/2020 Interstitial Cystitis (w/o hematuria) - 08/30/2020, Chronic interstitial cystitis without hematuria, - 2014 Suprapubic pain - 08/30/2020 Urinary Frequency - 08/30/2020, Increased urinary frequency, - 2016    NON-GU PMH: Segmental and somatic dysfunction of pelvic region - 09/24/2020 Encounter for general adult medical examination without abnormal findings, Encounter for preventive health examination - 2015 Anxiety, Anxiety (Symptom) - 2014 Personal history of other mental and behavioral disorders, History of depression - 2014    FAMILY HISTORY: Acute Myocardial Infarction - Runs In Family hepatic cirrhosis - Runs In Family No Significant Family History - Runs In Family   SOCIAL HISTORY: Marital Status: Married Preferred Language: English; Ethnicity: Not  Hispanic Or Latino; Race: White Current Smoking Status: Patient smokes occasionally.   Tobacco Use Assessment Completed: Used Tobacco in last 30 days? Has never drank.  Drinks 1 caffeinated drink per day.     Notes: Former smoker, Marital History - Single   REVIEW OF SYSTEMS:     GU Review Female:   Patient reports frequent urination, hard to postpone urination, get up at night to urinate, leakage of urine, trouble starting your stream, and have to strain to urinate. Patient denies burning /pain with urination, stream starts and stops, and being pregnant.  Gastrointestinal (Upper):   Patient reports nausea and indigestion/ heartburn. Patient denies vomiting.  Gastrointestinal (Lower):   Patient denies diarrhea and constipation.  Constitutional:   Patient denies fever, night sweats, weight loss, and fatigue.  Skin:   Patient denies skin rash/ lesion and itching.  Eyes:   Patient denies blurred vision and double vision.  Ears/ Nose/ Throat:   Patient denies sore throat and sinus problems.  Hematologic/Lymphatic:   Patient denies swollen glands and easy bruising.  Cardiovascular:   Patient denies leg swelling and chest pains.  Respiratory:   Patient denies cough and shortness of breath.  Endocrine:   Patient denies excessive thirst.  Musculoskeletal:   Patient denies back pain and joint pain.  Neurological:   Patient denies headaches and dizziness.  Psychologic:   Patient denies depression and anxiety.   VITAL SIGNS:      08/01/2021 11:18 AM  Weight 180 lb / 81.65 kg  Height 63 in / 160.02 cm  BP 121/78 mmHg  Pulse 62 /min  Temperature 98.0 F / 36.6 C  BMI 31.9 kg/m   MULTI-SYSTEM PHYSICAL EXAMINATION:    Constitutional: Well-nourished. No physical deformities. Normally developed. Good grooming.  Neck: Neck symmetrical, not swollen. Normal tracheal position.  Respiratory: No labored breathing, no use of accessory muscles.   Cardiovascular: Normal temperature, normal extremity pulses, no swelling, no varicosities.  Skin: No paleness, no jaundice, no cyanosis. No lesion, no ulcer, no rash.  Neurologic / Psychiatric: Oriented to time, oriented to place, oriented to person. No depression, no anxiety, no agitation.  Gastrointestinal: No mass, no tenderness, no  rigidity, non obese abdomen. Mild left CVA tenderness.  Musculoskeletal: Normal gait and station of head and neck.     Complexity of Data:  Source Of History:  Patient, Medical Record Summary  Records Review:   Previous Doctor Records, Previous Hospital Records, Previous Patient Records  Urine Test Review:   Urinalysis, Urine Culture  X-Ray Review: KUB: Reviewed Films. Discussed With Patient.  C.T. Abdomen/Pelvis: Reviewed Films. Reviewed Report.     08/01/21  Urinalysis  Urine Appearance Cloudy   Urine Color Yellow   Urine Glucose Neg mg/dL  Urine Bilirubin Neg mg/dL  Urine Ketones Neg mg/dL  Urine Specific Gravity 1.025   Urine Blood 3+ ery/uL  Urine pH 6.0   Urine Protein 1+ mg/dL  Urine Urobilinogen 1.0 mg/dL  Urine Nitrites Neg   Urine Leukocyte Esterase 1+ leu/uL  Urine WBC/hpf 0 - 5/hpf   Urine RBC/hpf 10 - 20/hpf   Urine Epithelial Cells 0 - 5/hpf   Urine Bacteria Mod (26-50/hpf)   Urine Mucous Present   Urine Yeast NS (Not Seen)   Urine Trichomonas Not Present   Urine Cystals NS (Not Seen)   Urine Casts NS (Not Seen)   Urine Sperm Not Present    PROCEDURES:         KUB - 45625  A single view  of the abdomen is obtained. Previously identified ureteral calculi has transitioned distally some but still identified between left lumbar vertebrae L3 and L4. No other opacities consistent with nephrolithiasis identified on today's exam.      Patient confirmed No Neulasta OnPro Device.            Urinalysis w/Scope Dipstick Dipstick Cont'd Micro  Color: Yellow Bilirubin: Neg mg/dL WBC/hpf: 0 - 5/hpf  Appearance: Cloudy Ketones: Neg mg/dL RBC/hpf: 10 - 78/EUM  Specific Gravity: 1.025 Blood: 3+ ery/uL Bacteria: Mod (26-50/hpf)  pH: 6.0 Protein: 1+ mg/dL Cystals: NS (Not Seen)  Glucose: Neg mg/dL Urobilinogen: 1.0 mg/dL Casts: NS (Not Seen)    Nitrites: Neg Trichomonas: Not Present    Leukocyte Esterase: 1+ leu/uL Mucous: Present      Epithelial Cells: 0 - 5/hpf       Yeast: NS (Not Seen)      Sperm: Not Present    Notes: microscopic not concentrated    ASSESSMENT:      ICD-10 Details  1 GU:   Ureteral calculus - N20.1 Left, Acute, Systemic Symptoms   PLAN:            Medications Refill Meds: Ondansetron Hcl 4 mg tablet 1 tablet PO Q 6 H PRN   #20  1 Refill(s)    New Meds: Oxycodone-Acetaminophen 5 mg-325 mg tablet 1 tablet PO Q 6 H PRN   #10  0 Refill(s)            Orders Labs Urine Culture  X-Rays: KUB          Schedule Return Visit/Planned Activity: Next Available Appointment - Schedule Surgery, Follow up MD          Document Letter(s):  Created for Patient: Clinical Summary         Notes:   Patient remains symptomatic from a left proximal ureteral calculi easily seen on today's KUB. She is eager for definitive stone treatment. Given the stones visibility she is a great candidate for shockwave lithotripsy. This was discussed in detail. I will send a message to her urologist regarding today's findings on imaging and her continued symptoms. If agreeable, I will work on expediting her getting set up for next available shockwave lithotripsy. She will continue tamsulosin in the meantime. She can continue alternating anti-inflammatories such as Tylenol and ibuprofen for symptom management. I will send refills for Zofran and Percocet today. (PMP aware assessed prior to prescribing) She will continue monitoring for stone material passage and report back to the office in the event this occurs. Liberal hydration and nourishment strongly encouraged. Activity also recommended as this will potentially facilitate interval stone material passage. Precautionary urine culture sent today.   For shockwave lithotripsy I described the risks which include arrhythmia, kidney contusion, kidney hemorrhage, need for transfusion, long-term risk of diabetes or hypertension, back discomfort, flank ecchymosis, flank abrasion, inability to break up stone, inability to  pass stone fragments, Steinstrasse, infection associated with obstructing stones, need for different surgical procedure and possible need for repeat shockwave lithotripsy.    * Signed by Anne Fu, NP on 08/01/21 at 11:58 AM (EDT)*

## 2021-08-07 ENCOUNTER — Other Ambulatory Visit: Payer: Self-pay

## 2021-08-07 ENCOUNTER — Ambulatory Visit (HOSPITAL_COMMUNITY): Payer: Medicare Other

## 2021-08-07 ENCOUNTER — Encounter (HOSPITAL_BASED_OUTPATIENT_CLINIC_OR_DEPARTMENT_OTHER)
Admission: RE | Disposition: A | Discharge status: Home / Self Care | Payer: Self-pay | Source: Ambulatory Visit | Attending: Urology

## 2021-08-07 ENCOUNTER — Ambulatory Visit (HOSPITAL_BASED_OUTPATIENT_CLINIC_OR_DEPARTMENT_OTHER)
Admission: RE | Admit: 2021-08-07 | Discharge: 2021-08-07 | Disposition: A | Discharge status: Home / Self Care | Payer: Medicare Other | Source: Ambulatory Visit | Attending: Urology | Admitting: Urology

## 2021-08-07 ENCOUNTER — Encounter (HOSPITAL_BASED_OUTPATIENT_CLINIC_OR_DEPARTMENT_OTHER): Payer: Self-pay | Admitting: Urology

## 2021-08-07 DIAGNOSIS — F17210 Nicotine dependence, cigarettes, uncomplicated: Secondary | ICD-10-CM | POA: Insufficient documentation

## 2021-08-07 DIAGNOSIS — N202 Calculus of kidney with calculus of ureter: Secondary | ICD-10-CM | POA: Diagnosis not present

## 2021-08-07 DIAGNOSIS — I209 Angina pectoris, unspecified: Secondary | ICD-10-CM | POA: Insufficient documentation

## 2021-08-07 DIAGNOSIS — I10 Essential (primary) hypertension: Secondary | ICD-10-CM | POA: Diagnosis not present

## 2021-08-07 DIAGNOSIS — N201 Calculus of ureter: Secondary | ICD-10-CM

## 2021-08-07 DIAGNOSIS — Z8249 Family history of ischemic heart disease and other diseases of the circulatory system: Secondary | ICD-10-CM | POA: Insufficient documentation

## 2021-08-07 HISTORY — PX: EXTRACORPOREAL SHOCK WAVE LITHOTRIPSY: SHX1557

## 2021-08-07 LAB — POCT PREGNANCY, URINE: Preg Test, Ur: NEGATIVE

## 2021-08-07 SURGERY — LITHOTRIPSY, ESWL
Anesthesia: LOCAL | Laterality: Left

## 2021-08-07 MED ORDER — DIAZEPAM 5 MG PO TABS
ORAL_TABLET | ORAL | Status: AC
  Filled 2021-08-07: qty 2

## 2021-08-07 MED ORDER — DIPHENHYDRAMINE HCL 25 MG PO CAPS
25.0000 mg | ORAL_CAPSULE | ORAL | Status: AC
  Administered 2021-08-07: 25 mg via ORAL

## 2021-08-07 MED ORDER — OXYCODONE-ACETAMINOPHEN 5-325 MG PO TABS: 1.0000 | ORAL_TABLET | ORAL | 0 refills | Status: DC | PRN

## 2021-08-07 MED ORDER — DIAZEPAM 5 MG PO TABS
10.0000 mg | ORAL_TABLET | ORAL | Status: AC
  Administered 2021-08-07: 10 mg via ORAL

## 2021-08-07 MED ORDER — SODIUM CHLORIDE 0.9 % IV SOLN: INTRAVENOUS | Status: DC

## 2021-08-07 MED ORDER — DIPHENHYDRAMINE HCL 25 MG PO CAPS
ORAL_CAPSULE | ORAL | Status: AC
  Filled 2021-08-07: qty 1

## 2021-08-07 MED ORDER — CIPROFLOXACIN HCL 500 MG PO TABS
ORAL_TABLET | ORAL | Status: AC
  Filled 2021-08-07: qty 1

## 2021-08-07 MED ORDER — CIPROFLOXACIN HCL 500 MG PO TABS
500.0000 mg | ORAL_TABLET | ORAL | Status: AC
  Administered 2021-08-07: 500 mg via ORAL

## 2021-08-07 NOTE — Op Note (Signed)
See [facility] operative note scanned into chart. Also because of the size, density, location and other factors that cannot be anticipated I feel this will likely be a staged procedure. This fact supersedes any indication in the scanned [facility] operative note to the contrary.  

## 2021-08-07 NOTE — Interval H&P Note (Signed)
History and Physical Interval Note:  08/07/2021 9:04 AM  Christina Cox  has presented today for surgery, with the diagnosis of LEFT URETERAL CALCULI.  The various methods of treatment have been discussed with the patient and family. After consideration of risks, benefits and other options for treatment, the patient has consented to  Procedure(s): EXTRACORPOREAL SHOCK WAVE LITHOTRIPSY (ESWL) (Left) as a surgical intervention.  The patient's history has been reviewed, patient examined, no change in status, stable for surgery.  I have reviewed the patient's chart and labs.  Questions were answered to the patient's satisfaction.     [employer]

## 2021-08-07 NOTE — Discharge Instructions (Signed)
1. You should strain your urine and collect all fragments and bring them to your follow up appointment.  °2. You should take your pain medication as needed.  Please call if your pain is severe to the point that it is not controlled with your pain medication. °3. You should call if you develop fever > 101 or persistent nausea or vomiting. °4. Your doctor may prescribe tamsulosin to take to help facilitate stone passage. °

## 2021-08-08 ENCOUNTER — Encounter (HOSPITAL_BASED_OUTPATIENT_CLINIC_OR_DEPARTMENT_OTHER): Payer: Self-pay | Admitting: Urology

## 2021-11-04 ENCOUNTER — Other Ambulatory Visit (HOSPITAL_COMMUNITY): Payer: Self-pay

## 2022-04-02 ENCOUNTER — Other Ambulatory Visit: Payer: Self-pay | Admitting: Obstetrics and Gynecology

## 2022-04-02 DIAGNOSIS — Z1231 Encounter for screening mammogram for malignant neoplasm of breast: Secondary | ICD-10-CM

## 2022-04-07 ENCOUNTER — Other Ambulatory Visit: Payer: Self-pay | Admitting: Obstetrics and Gynecology

## 2022-04-07 ENCOUNTER — Ambulatory Visit
Admission: RE | Admit: 2022-04-07 | Discharge: 2022-04-07 | Disposition: A | Discharge status: Home / Self Care | Payer: Medicare Other | Source: Ambulatory Visit | Attending: Obstetrics and Gynecology | Admitting: Obstetrics and Gynecology

## 2022-04-07 DIAGNOSIS — Z1231 Encounter for screening mammogram for malignant neoplasm of breast: Secondary | ICD-10-CM

## 2022-05-18 NOTE — H&P (Signed)
Christina Cox is an 41 y.o. [id number] who is admitted for Hysteroscopy, possible Myosure polypectomy and Hydrothermal endometrial ablation for AUB (heavy menstrual bleeding).  Patient reports regular, but heavy and painful periods, worst in the last 2-3 years.. Patient has previously used contraceptives but has negative mood side effects from them. Has previously used an IUD, patches, vaginal ring, and depo provera. She does not feel she can use Lysteda regularly. Unable to use NSAIDs due to history of gastric sleeve. Reports diagnosis of interstitial cystitis and has overactive bladder (follows with [facility]). After consideration of all of her options, patient opts for endometrial ablation.  Work-up: Pap smear (03/03/22): NILM/HRHPV negative  EMB (04/15/22): Secretory endometrium. No hyperplasia, carcinoma, or endometritis.  CBC: H/H 13.8/40.9  TSH 0.83 (wnl)  Urine HCG (7/5): Negative  TVUS (03/24/22): Uterus 8.14 x 3.52 x 4.37cm Right ovary 2.83cm  Left ovary 2.39cm Anteverted uterus. Uterus - wnl. Endometrium wnl. Patient on cycle at the time of [place]. Right ovary - simple cyst - 1.4 x 1.4 x 1.3cm, avascular. Left ovary wnl. Small amount of free fluid in posterior cul-de-sac. No adnexal masses seen.   Patient Active Problem List   Diagnosis Date Noted   Status post sleeve gastrectomy 05/12/2021   Class 1 obesity with serious comorbidity and body mass index (BMI) of 30.0 to 30.9 in adult 12/29/2018   Insulin resistance 12/08/2018   Vitamin D deficiency 12/08/2018   UTI (urinary tract infection) 10/23/2017   Bipolar I disorder, most recent episode depressed (HCC) 10/20/2017   MDD (major depressive disorder), severe (HCC) 10/20/2017   Advanced maternal age in multigravida 12/25/2016   Migraine without aura 06/13/2013   Depression 06/13/2013   Anxiety 06/13/2013   Insomnia 06/13/2013   Chronic migraine 06/13/2013    MEDICAL/FAMILY/SOCIAL HX: No LMP recorded.    Past  Medical History:  Diagnosis Date   Anxiety    Back pain    Chest pain    Chronic migraine    Constipation    Headache    Hypertension    IBS (irritable bowel syndrome)    Interstitial cystitis    Knee pain    Stiff neck    Swelling of left lower extremity    Swelling of lower extremity    Trouble in sleeping    Wears glasses     Past Surgical History:  Procedure Laterality Date   AUGMENTATION MAMMAPLASTY     BREAST ENHANCEMENT SURGERY Bilateral 10/12/2010   BREAST SURGERY     implants   BUNIONECTOMY Right age 62   BUNIONECTOMY     CESAREAN SECTION N/A 12/26/2016   Procedure: CESAREAN SECTION;  Surgeon: Philip Aspen, DO;  Location: [facility];  Service: Obstetrics;  Laterality: N/A;   CYSTO WITH HYDRODISTENSION N/A 10/13/2013   Procedure: CYSTOSCOPY/HYDRODISTENSION;  Surgeon: Crist Fat, MD;  Location: [facility];  Service: Urology;  Laterality: N/A;   EXTRACORPOREAL SHOCK WAVE LITHOTRIPSY Left 08/07/2021   Procedure: EXTRACORPOREAL SHOCK WAVE LITHOTRIPSY (ESWL);  Surgeon: Heloise Purpura, MD;  Location: [facility];  Service: Urology;  Laterality: Left;   INTRAUTERINE DEVICE (IUD) INSERTION  10/13/2011   mirena   UPPER GI ENDOSCOPY N/A 05/12/2021   Procedure: UPPER GI ENDOSCOPY;  Surgeon: Luretha Murphy, MD;  Location: [facility];  Service: General;  Laterality: N/A;   WISDOM TOOTH EXTRACTION  age 40    Family History  Problem Relation Age of Onset   Hypertension Father    Heart attack Father  Hyperlipidemia Father    Heart disease Father    Stroke Father    Migraines Mother    Alcohol abuse Mother    Depression Mother    Mental illness Mother    Cancer Mother    Depression Maternal Aunt    Depression Maternal Grandmother    Cancer Maternal Grandmother        lung cancer-smoker    Social History:  reports that she quit smoking about 14 years ago. Her smoking use included cigarettes. She has never used smokeless tobacco. She reports  that she does not drink alcohol and does not use drugs.  ALLERGIES/MEDS:  Allergies:  Allergies  Allergen Reactions   Codeine Itching    No medications prior to admission.     Review of Systems  Constitutional: Negative.   HENT: Negative.    Eyes: Negative.   Respiratory: Negative.    Cardiovascular: Negative.   Gastrointestinal: Negative.   Genitourinary: Negative.   Musculoskeletal: Negative.   Skin: Negative.   Neurological: Negative.   Endo/Heme/Allergies: Negative.   Psychiatric/Behavioral: Negative.      There were no vitals taken for this visit. Gen:  NAD, pleasant and cooperative Cardio:  RRR Pulm:  CTAB, no wheezes/rales/rhonchi Abd:  Soft, non-distended, non-tender throughout, no rebound/guarding Ext:  No bilateral LE edema, no bilateral calf tenderness Pelvic: Labia - unremarkable, vagina - pink moist mucosa, no lesions or abnormal discharge, cervix - no discharge or lesions or CMT, adnexa - no masses or tenderness - uterus non-tender and normal size on palpation   No results found for this or any previous visit (from the past 24 hour(s)).  No results found.   ASSESSMENT/PLAN: Christina Cox is a 41 y.o. G2P2002 who is admitted for G2P2002 who is admitted for Hysteroscopy, possible Myosure polypectomy and Hydrothermal endometrial ablation for AUB (heavy menstrual bleeding).  - Admit to [facility] - Admit labs (CBC, T&S, COVID screen per protocol) - Diet:  ERAS pathway/per anesthesia - IVF:  Per anesthesia - VTE Prophylaxis:  SCDs - Antibiotics: None - D/C home same-day  Consents:  I discussed with the patient that this surgery is performed to look inside the uterus, remove the uterine lining and ablate the lining of the uterus.  Prior to surgery, the risks and benefits of the surgery, as well as alternative treatments, have been discussed.  The risks include, but are not limited to bleeding, including the need for a blood transfusion, infection, damage to  organs and tissues, including uterine perforation, requiring additional surgery, postoperative pain, short-term and long-term, failure of the procedure to control symptoms, need for hysterectomy to control bleeding or pain, fluid overload, which could create electrolyte abnormalities and the need to stop the procedure before completion, inability to safely complete the procedure, deep vein thrombosis and/or pulmonary embolism, painful intercourse, pregnancy complications resulting in poor pregnancy outcome, complications the course of which cannot be predicted or prevented, and death.   I also discussed with the patient that a pregnancy after an endometrial ablation is dangerous to the patient and to the fetus.  It is highly recommended that patients undergoing an endometrial ablation use a reliable form of contraception.  The chosen method is condoms.  Patient was consented for blood products.  The patient is aware that bleeding may result in the need for a blood transfusion which includes risk of transmission of HIV (1:2 million), Hepatitis C (1:2 million), and Hepatitis B (1:200 thousand) and transfusion reaction.  Patient voiced understanding of the above risks  as well as understanding of indications for blood transfusion.    Steva Ready, DO [phone] (office)

## 2022-05-21 ENCOUNTER — Encounter (HOSPITAL_BASED_OUTPATIENT_CLINIC_OR_DEPARTMENT_OTHER): Payer: Self-pay | Admitting: Obstetrics and Gynecology

## 2022-05-22 ENCOUNTER — Encounter (HOSPITAL_BASED_OUTPATIENT_CLINIC_OR_DEPARTMENT_OTHER): Payer: Self-pay | Admitting: Obstetrics and Gynecology

## 2022-05-22 NOTE — Progress Notes (Signed)
Spoke w/ via phone for pre-op interview--- pt Lab needs dos----   urine preg (per anes)/  pre-op orders pending            Lab results------ no COVID test -----patient states asymptomatic no test needed Arrive at ------- 1015 on 05-29-2022 NPO after MN NO Solid Food.  Clear liquids from MN until--- 0915 Med rec completed Medications to take morning of surgery ----- none Diabetic medication ----- n/a Patient instructed no nail polish to be worn day of surgery Patient instructed to bring photo id and insurance card day of surgery Patient aware to have Driver (ride ) / caregiver for 24 hours after surgery -- husband, Christina Cox Patient Special Instructions ----- n/a Pre-Op special Istructions ----- sent inbox message to dr Connye Burkitt in epic, requested orders Patient verbalized understanding of instructions that were given at this phone interview. Patient denies shortness of breath, chest pain, fever, cough at this phone interview.

## 2022-05-29 ENCOUNTER — Other Ambulatory Visit: Payer: Self-pay

## 2022-05-29 ENCOUNTER — Ambulatory Visit (HOSPITAL_BASED_OUTPATIENT_CLINIC_OR_DEPARTMENT_OTHER): Payer: Medicare Other | Admitting: Anesthesiology

## 2022-05-29 ENCOUNTER — Encounter (HOSPITAL_BASED_OUTPATIENT_CLINIC_OR_DEPARTMENT_OTHER): Payer: Self-pay | Admitting: Obstetrics and Gynecology

## 2022-05-29 ENCOUNTER — Encounter (HOSPITAL_BASED_OUTPATIENT_CLINIC_OR_DEPARTMENT_OTHER)
Admission: RE | Disposition: A | Discharge status: Home / Self Care | Payer: Self-pay | Source: Home / Self Care | Attending: Obstetrics and Gynecology

## 2022-05-29 ENCOUNTER — Ambulatory Visit (HOSPITAL_BASED_OUTPATIENT_CLINIC_OR_DEPARTMENT_OTHER)
Admission: RE | Admit: 2022-05-29 | Discharge: 2022-05-29 | Disposition: A | Discharge status: Home / Self Care | Payer: Medicare Other | Attending: Obstetrics and Gynecology | Admitting: Obstetrics and Gynecology

## 2022-05-29 DIAGNOSIS — Z6828 Body mass index (BMI) 28.0-28.9, adult: Secondary | ICD-10-CM | POA: Insufficient documentation

## 2022-05-29 DIAGNOSIS — Z01818 Encounter for other preprocedural examination: Secondary | ICD-10-CM

## 2022-05-29 DIAGNOSIS — F319 Bipolar disorder, unspecified: Secondary | ICD-10-CM | POA: Insufficient documentation

## 2022-05-29 DIAGNOSIS — N939 Abnormal uterine and vaginal bleeding, unspecified: Secondary | ICD-10-CM | POA: Diagnosis not present

## 2022-05-29 DIAGNOSIS — Z87891 Personal history of nicotine dependence: Secondary | ICD-10-CM | POA: Insufficient documentation

## 2022-05-29 DIAGNOSIS — F419 Anxiety disorder, unspecified: Secondary | ICD-10-CM | POA: Diagnosis not present

## 2022-05-29 DIAGNOSIS — Z9884 Bariatric surgery status: Secondary | ICD-10-CM | POA: Insufficient documentation

## 2022-05-29 DIAGNOSIS — N920 Excessive and frequent menstruation with regular cycle: Secondary | ICD-10-CM | POA: Diagnosis present

## 2022-05-29 HISTORY — DX: Abnormal uterine and vaginal bleeding, unspecified: N93.9

## 2022-05-29 HISTORY — DX: Personal history of urinary calculi: Z87.442

## 2022-05-29 HISTORY — PX: DILITATION & CURRETTAGE/HYSTROSCOPY WITH HYDROTHERMAL ABLATION: SHX5570

## 2022-05-29 HISTORY — DX: Personal history of other diseases of the circulatory system: Z86.79

## 2022-05-29 HISTORY — DX: Chronic migraine without aura, not intractable, without status migrainosus: G43.709

## 2022-05-29 LAB — TYPE AND SCREEN
ABO/RH(D): A POS
Antibody Screen: NEGATIVE

## 2022-05-29 LAB — CBC
HCT: 41.2 % (ref 36.0–46.0)
Hemoglobin: 13.7 g/dL (ref 12.0–15.0)
MCH: 30.3 pg (ref 26.0–34.0)
MCHC: 33.3 g/dL (ref 30.0–36.0)
MCV: 91.2 fL (ref 80.0–100.0)
Platelets: 310 10*3/uL (ref 150–400)
RBC: 4.52 MIL/uL (ref 3.87–5.11)
RDW: 12.4 % (ref 11.5–15.5)
WBC: 6.5 10*3/uL (ref 4.0–10.5)
nRBC: 0 % (ref 0.0–0.2)

## 2022-05-29 LAB — POCT PREGNANCY, URINE: Preg Test, Ur: NEGATIVE

## 2022-05-29 SURGERY — DILATATION & CURETTAGE/HYSTEROSCOPY WITH HYDROTHERMAL ABLATION
Anesthesia: General | Site: Uterus

## 2022-05-29 MED ORDER — KETOROLAC TROMETHAMINE 30 MG/ML IJ SOLN
INTRAMUSCULAR | Status: AC
  Filled 2022-05-29: qty 1

## 2022-05-29 MED ORDER — ACETAMINOPHEN 10 MG/ML IV SOLN
INTRAVENOUS | Status: AC
  Filled 2022-05-29: qty 100

## 2022-05-29 MED ORDER — ONDANSETRON HCL 4 MG/2ML IJ SOLN
INTRAMUSCULAR | Status: DC | PRN
  Administered 2022-05-29: 4 mg via INTRAVENOUS

## 2022-05-29 MED ORDER — OXYCODONE HCL 5 MG PO TABS
ORAL_TABLET | ORAL | Status: AC
  Filled 2022-05-29: qty 1

## 2022-05-29 MED ORDER — OXYCODONE HCL 5 MG/5ML PO SOLN: 5.0000 mg | Freq: Once | ORAL | Status: AC | PRN

## 2022-05-29 MED ORDER — FENTANYL CITRATE (PF) 100 MCG/2ML IJ SOLN
INTRAMUSCULAR | Status: AC
  Filled 2022-05-29: qty 2

## 2022-05-29 MED ORDER — MIDAZOLAM HCL 2 MG/2ML IJ SOLN
INTRAMUSCULAR | Status: DC | PRN
  Administered 2022-05-29: 2 mg via INTRAVENOUS

## 2022-05-29 MED ORDER — OXYCODONE HCL 5 MG PO TABS
5.0000 mg | ORAL_TABLET | Freq: Once | ORAL | Status: AC | PRN
  Administered 2022-05-29: 5 mg via ORAL

## 2022-05-29 MED ORDER — DEXAMETHASONE SODIUM PHOSPHATE 10 MG/ML IJ SOLN
INTRAMUSCULAR | Status: DC | PRN
  Administered 2022-05-29: 10 mg via INTRAVENOUS

## 2022-05-29 MED ORDER — PROPOFOL 10 MG/ML IV BOLUS
INTRAVENOUS | Status: DC | PRN
  Administered 2022-05-29: 200 mg via INTRAVENOUS

## 2022-05-29 MED ORDER — PROMETHAZINE HCL 25 MG/ML IJ SOLN
6.2500 mg | INTRAMUSCULAR | Status: DC | PRN
  Administered 2022-05-29: 6.25 mg via INTRAVENOUS

## 2022-05-29 MED ORDER — SILVER NITRATE-POT NITRATE 75-25 % EX MISC
CUTANEOUS | Status: DC | PRN
  Administered 2022-05-29: 2

## 2022-05-29 MED ORDER — MIDAZOLAM HCL 2 MG/2ML IJ SOLN
INTRAMUSCULAR | Status: AC
  Filled 2022-05-29: qty 2

## 2022-05-29 MED ORDER — ONDANSETRON HCL 4 MG/2ML IJ SOLN
INTRAMUSCULAR | Status: AC
  Filled 2022-05-29: qty 2

## 2022-05-29 MED ORDER — SODIUM CHLORIDE 0.9 % IR SOLN
Status: DC | PRN
  Administered 2022-05-29: 3000 mL

## 2022-05-29 MED ORDER — PROPOFOL 10 MG/ML IV BOLUS
INTRAVENOUS | Status: AC
  Filled 2022-05-29: qty 20

## 2022-05-29 MED ORDER — LACTATED RINGERS IV SOLN: INTRAVENOUS | Status: DC

## 2022-05-29 MED ORDER — CELECOXIB 200 MG PO CAPS
200.0000 mg | ORAL_CAPSULE | Freq: Once | ORAL | Status: AC
  Administered 2022-05-29: 200 mg via ORAL

## 2022-05-29 MED ORDER — DEXMEDETOMIDINE HCL IN NACL 80 MCG/20ML IV SOLN
INTRAVENOUS | Status: AC
  Filled 2022-05-29: qty 20

## 2022-05-29 MED ORDER — LIDOCAINE 2% (20 MG/ML) 5 ML SYRINGE
INTRAMUSCULAR | Status: DC | PRN
  Administered 2022-05-29: 60 mg via INTRAVENOUS

## 2022-05-29 MED ORDER — PROMETHAZINE HCL 25 MG/ML IJ SOLN
INTRAMUSCULAR | Status: AC
  Filled 2022-05-29: qty 1

## 2022-05-29 MED ORDER — METHOCARBAMOL 1000 MG/10ML IJ SOLN
500.0000 mg | Freq: Once | INTRAVENOUS | Status: AC
  Administered 2022-05-29: 500 mg via INTRAVENOUS
  Filled 2022-05-29: qty 500

## 2022-05-29 MED ORDER — OXYCODONE HCL 5 MG PO TABS: 5.0000 mg | ORAL_TABLET | Freq: Once | ORAL | Status: DC | PRN

## 2022-05-29 MED ORDER — DEXAMETHASONE SODIUM PHOSPHATE 10 MG/ML IJ SOLN
INTRAMUSCULAR | Status: AC
  Filled 2022-05-29: qty 1

## 2022-05-29 MED ORDER — DEXMEDETOMIDINE (PRECEDEX) IN NS 20 MCG/5ML (4 MCG/ML) IV SYRINGE
PREFILLED_SYRINGE | INTRAVENOUS | Status: DC | PRN
  Administered 2022-05-29 (×3): 4 ug via INTRAVENOUS

## 2022-05-29 MED ORDER — ACETAMINOPHEN 500 MG PO TABS
ORAL_TABLET | ORAL | Status: AC
  Filled 2022-05-29: qty 2

## 2022-05-29 MED ORDER — CELECOXIB 200 MG PO CAPS
ORAL_CAPSULE | ORAL | Status: AC
  Filled 2022-05-29: qty 1

## 2022-05-29 MED ORDER — FENTANYL CITRATE (PF) 100 MCG/2ML IJ SOLN: 25.0000 ug | INTRAMUSCULAR | Status: DC | PRN

## 2022-05-29 MED ORDER — ACETAMINOPHEN 500 MG PO TABS
1000.0000 mg | ORAL_TABLET | Freq: Once | ORAL | Status: AC
  Administered 2022-05-29: 1000 mg via ORAL

## 2022-05-29 MED ORDER — LIDOCAINE HCL (PF) 2 % IJ SOLN
INTRAMUSCULAR | Status: AC
  Filled 2022-05-29: qty 5

## 2022-05-29 MED ORDER — OXYCODONE HCL 5 MG PO TABS: 5.0000 mg | ORAL_TABLET | Freq: Four times a day (QID) | ORAL | 0 refills | Status: AC | PRN

## 2022-05-29 MED ORDER — ACETAMINOPHEN 10 MG/ML IV SOLN
1000.0000 mg | Freq: Once | INTRAVENOUS | Status: AC
  Administered 2022-05-29: 1000 mg via INTRAVENOUS

## 2022-05-29 MED ORDER — KETOROLAC TROMETHAMINE 30 MG/ML IJ SOLN
30.0000 mg | Freq: Once | INTRAMUSCULAR | Status: AC
Start: 2022-05-29 — End: 2022-05-29
  Administered 2022-05-29: 30 mg via INTRAVENOUS

## 2022-05-29 MED ORDER — FENTANYL CITRATE (PF) 100 MCG/2ML IJ SOLN
INTRAMUSCULAR | Status: DC | PRN
  Administered 2022-05-29: 25 ug via INTRAVENOUS
  Administered 2022-05-29: 50 ug via INTRAVENOUS
  Administered 2022-05-29: 25 ug via INTRAVENOUS

## 2022-05-29 SURGICAL SUPPLY — 19 items
CATH ROBINSON RED A/P 16FR (CATHETERS) ×1 IMPLANT
DRSG TELFA 3X8 NADH (GAUZE/BANDAGES/DRESSINGS) ×1 IMPLANT
ELECT REM PT RETURN 9FT ADLT (ELECTROSURGICAL)
ELECTRODE REM PT RTRN 9FT ADLT (ELECTROSURGICAL) IMPLANT
GAUZE 4X4 16PLY ~~LOC~~+RFID DBL (SPONGE) ×1 IMPLANT
GLOVE BIO SURGEON STRL SZ 6.5 (GLOVE) ×2 IMPLANT
GLOVE BIOGEL PI IND STRL 6.5 (GLOVE) ×1 IMPLANT
GLOVE BIOGEL PI IND STRL 7.0 (GLOVE) ×1 IMPLANT
GLOVE BIOGEL PI INDICATOR 6.5 (GLOVE) ×1
GLOVE BIOGEL PI INDICATOR 7.0 (GLOVE) ×1
GOWN STRL REUS W/TWL LRG LVL3 (GOWN DISPOSABLE) ×2 IMPLANT
KIT TURNOVER CYSTO (KITS) ×1 IMPLANT
PACK VAGINAL MINOR WOMEN LF (CUSTOM PROCEDURE TRAY) ×1 IMPLANT
PAD DRESSING TELFA 3X8 NADH (GAUZE/BANDAGES/DRESSINGS) ×1 IMPLANT
PAD OB MATERNITY 4.3X12.25 (PERSONAL CARE ITEMS) ×1 IMPLANT
SEAL ROD LENS SCOPE MYOSURE (ABLATOR) ×1 IMPLANT
SET GENESYS HTA PROCERVA (MISCELLANEOUS) IMPLANT
SYR BULB IRRIG 60ML STRL (SYRINGE) IMPLANT
UNDERPAD 30X36 HEAVY ABSORB (UNDERPADS AND DIAPERS) ×1 IMPLANT

## 2022-05-29 NOTE — Anesthesia Procedure Notes (Signed)
Procedure Name: LMA Insertion Date/Time: 05/29/2022 12:28 PM  Performed by: Norva Pavlov, CRNAPre-anesthesia Checklist: Patient identified, Emergency Drugs available, Suction available and Patient being monitored Patient Re-evaluated:Patient Re-evaluated prior to induction Oxygen Delivery Method: Circle system utilized Preoxygenation: Pre-oxygenation with 100% oxygen Induction Type: IV induction Ventilation: Mask ventilation without difficulty LMA: LMA inserted LMA Size: 4.0 Number of attempts: 1 Airway Equipment and Method: Bite block Placement Confirmation: positive ETCO2 Tube secured with: Tape Dental Injury: Teeth and Oropharynx as per pre-operative assessment

## 2022-05-29 NOTE — Transfer of Care (Signed)
Immediate Anesthesia Transfer of Care Note  Patient: Christina Cox  Procedure(s) Performed: Procedure(s) (LRB): DILATATION & CURETTAGE/HYSTEROSCOPY WITH HYDROTHERMAL ABLATION (N/A)  Patient Location: PACU  Anesthesia Type: General  Level of Consciousness: sleepy, opens eyes and follows commands  Airway & Oxygen Therapy: Patient Spontanous Breathing and Patient connected to nasal cannula oxygen  Post-op Assessment: Report given to PACU RN and Post -op Vital signs reviewed and stable  Post vital signs: Reviewed and stable  Complications: No apparent anesthesia complications  Last Vitals:  Vitals Value Taken Time  BP 123/67 05/29/22 1311  Temp 36.7 C 05/29/22 1311  Pulse 65 05/29/22 1315  Resp 12 05/29/22 1315  SpO2 96 % 05/29/22 1315  Vitals shown include unvalidated device data.  Last Pain:  Vitals:   05/29/22 1042  TempSrc: Oral  PainSc: 0-No pain      Patients Stated Pain Goal: 5 (05/29/22 1042)  Complications: No notable events documented.

## 2022-05-29 NOTE — Discharge Instructions (Addendum)
  Post Anesthesia Home Care Instructions  Activity: Get plenty of rest for the remainder of the day. A responsible individual must stay with you for 24 hours following the procedure.  For the next 24 hours, DO NOT: -Drive a car -Advertising copywriter -Drink alcoholic beverages -Take any medication unless instructed by your physician -Make any legal decisions or sign important papers.  Meals: Start with liquid foods such as gelatin or soup. Progress to regular foods as tolerated. Avoid greasy, spicy, heavy foods. If nausea and/or vomiting occur, drink only clear liquids until the nausea and/or vomiting subsides. Call your physician if vomiting continues.  Special Instructions/Symptoms: Your throat may feel dry or sore from the anesthesia or the breathing tube placed in your throat during surgery. If this causes discomfort, gargle with warm salt water. The discomfort should disappear within 24 hours.   No acetaminophen/Tylenol until after 10:30 pm today if needed. No ibuprofen, Advil, Aleve, Motrin, ketorolac, meloxicam, naproxen, or other NSAIDS until after 10:30 pm today if needed.

## 2022-05-29 NOTE — Progress Notes (Signed)
Patient questioned what RX was called in for her, she was told oxycodone.  She was upset that the RN had asked her husband to pick up RX.

## 2022-05-29 NOTE — Interval H&P Note (Signed)
History and Physical Interval Note:  05/29/2022 12:13 PM  Christina Cox  has presented today for surgery, with the diagnosis of N93.9 AUB.  The various methods of treatment have been discussed with the patient and family. After consideration of risks, benefits and other options for treatment, the patient has consented to  Procedure(s): DILATATION & CURETTAGE/HYSTEROSCOPY WITH HYDROTHERMAL ABLATION (N/A) as a surgical intervention.  The patient's history has been reviewed, patient examined, no change in status, stable for surgery.  I have reviewed the patient's chart and labs.  Questions were answered to the patient's satisfaction.     Steva Ready

## 2022-05-29 NOTE — Op Note (Signed)
Pre Op Dx:   Abnormal uterine bleeding (Heavy menstrual bleeding)  Post Op Dx:   Same as pre-operative diagnosis  Procedure:   Hysteroscopy with hydrothermal ablation   Surgeon:  Dr. Karman Veney Assistants:  None Anesthesia:  LMA   EBL:  2cc  IVF:  700cc UOP:  Voided prior to arrival to OR   Drains:  None Specimen removed:  None Device(s) implanted: None Case Type:  Clean-contaminated Findings:  Normal-appearing cervix and endocervical canal. Endometrium normal-appearing. Right tubal ostia very clearly visualized. Left tubal ostia more difficult to visualize due to slightly proliferative appearing endometrium over this area. Uterus sounded to 7cm. Complications: None  Indications:  41 y.o. G2P2 with AUB-HMB who desired endometrial ablation. Previous endometrial biopsy outpatient revealed secretory endometrium without hyperplasia, carcinoma, or endometritis.  Description of each procedure: After informed consent was obtained, the patient was taken to the operating room in the dorsal supine position. After administration of general anesthesia, the patient was placed in the dorsal lithotomy position and was prepped and draped in the usual sterile fashion. The anterior lip of the cervix was grasped with a single-tooth tenaculum   The uterus sounded to 7cm. The cervix was serially dilated to accommodate the hydrothermal hysteroscope. The bilateral tubal ostia were visualized. The findings as above were noted.  Prior to hydrothermal ablation, adequate seal and absence of uterine perforation was noted. The hydrothermal ablation was performed for 10 minutes and adequate cool down performed at the end of the procedure. The tenaculum sites were treated with silver nitrate.  Excellent hemostasis noted. All sponge and needle counts were correct x 2.  The patient was awakened, extubated, and appeared to have tolerated the procedure well.  Disposition:  PACU  Jaylon Boylen, DO  

## 2022-05-29 NOTE — Anesthesia Postprocedure Evaluation (Signed)
Anesthesia Post Note  Patient: Darrelyn Morro  Procedure(s) Performed: DILATATION & CURETTAGE/HYSTEROSCOPY WITH HYDROTHERMAL ABLATION (Uterus)     Patient location during evaluation: PACU Anesthesia Type: General Level of consciousness: awake and alert Pain management: pain level controlled Vital Signs Assessment: post-procedure vital signs reviewed and stable Respiratory status: spontaneous breathing, nonlabored ventilation and respiratory function stable Cardiovascular status: stable and blood pressure returned to baseline Anesthetic complications: no   No notable events documented.  Last Vitals:  Vitals:   05/29/22 1330 05/29/22 1340  BP: (!) 143/91 (!) 159/92  Pulse: 77 64  Resp: (!) 21 16  Temp:    SpO2: 98% 98%    Last Pain:  Vitals:   05/29/22 1359  TempSrc:   PainSc: 3                  Beryle Lathe

## 2022-05-29 NOTE — Anesthesia Preprocedure Evaluation (Addendum)
Anesthesia Evaluation  Patient identified by MRN, date of birth, ID band Patient awake    Reviewed: Allergy & Precautions, NPO status , Patient's Chart, lab work & pertinent test results  History of Anesthesia Complications Negative for: history of anesthetic complications  Airway Mallampati: II  TM Distance: >3 FB Neck ROM: Full    Dental  (+) Dental Advisory Given, Teeth Intact   Pulmonary neg pulmonary ROS, former smoker,    Pulmonary exam normal        Cardiovascular negative cardio ROS Normal cardiovascular exam     Neuro/Psych  Headaches, PSYCHIATRIC DISORDERS Anxiety Depression Bipolar Disorder    GI/Hepatic Neg liver ROS,  S/p gastric sleeve    Endo/Other  negative endocrine ROS  Renal/GU negative Renal ROS     Musculoskeletal negative musculoskeletal ROS (+)   Abdominal   Peds  Hematology negative hematology ROS (+)   Anesthesia Other Findings   Reproductive/Obstetrics  AUB                             Anesthesia Physical Anesthesia Plan  ASA: 2  Anesthesia Plan: General   Post-op Pain Management: Tylenol PO (pre-op)* and Celebrex PO (pre-op)*   Induction: Intravenous  PONV Risk Score and Plan: 3 and Treatment may vary due to age or medical condition and Ondansetron  Airway Management Planned: LMA  Additional Equipment: None  Intra-op Plan:   Post-operative Plan: Extubation in OR  Informed Consent: I have reviewed the patients History and Physical, chart, labs and discussed the procedure including the risks, benefits and alternatives for the proposed anesthesia with the patient or authorized representative who has indicated his/her understanding and acceptance.     Dental advisory given  Plan Discussed with: CRNA and Anesthesiologist  Anesthesia Plan Comments:        Anesthesia Quick Evaluation

## 2022-05-29 NOTE — Progress Notes (Deleted)
Pre Op Dx:   Abnormal uterine bleeding (Heavy menstrual bleeding)  Post Op Dx:   Same as pre-operative diagnosis  Procedure:   Hysteroscopy with hydrothermal ablation   Surgeon:  Dr. Steva Ready Assistants:  None Anesthesia:  LMA   EBL:  2cc  IVF:  700cc UOP:  Voided prior to arrival to OR   Drains:  None Specimen removed:  None Device(s) implanted: None Case Type:  Clean-contaminated Findings:  Normal-appearing cervix and endocervical canal. Endometrium normal-appearing. Right tubal ostia very clearly visualized. Left tubal ostia more difficult to visualize due to slightly proliferative appearing endometrium over this area. Uterus sounded to 7cm. Complications: None  Indications:  41 y.o. G2P2 with AUB-HMB who desired endometrial ablation. Previous endometrial biopsy outpatient revealed secretory endometrium without hyperplasia, carcinoma, or endometritis.  Description of each procedure: After informed consent was obtained, the patient was taken to the operating room in the dorsal supine position. After administration of general anesthesia, the patient was placed in the dorsal lithotomy position and was prepped and draped in the usual sterile fashion. The anterior lip of the cervix was grasped with a single-tooth tenaculum   The uterus sounded to 7cm. The cervix was serially dilated to accommodate the hydrothermal hysteroscope. The bilateral tubal ostia were visualized. The findings as above were noted.  Prior to hydrothermal ablation, adequate seal and absence of uterine perforation was noted. The hydrothermal ablation was performed for 10 minutes and adequate cool down performed at the end of the procedure. The tenaculum sites were treated with silver nitrate.  Excellent hemostasis noted. All sponge and needle counts were correct x 2.  The patient was awakened, extubated, and appeared to have tolerated the procedure well.  Disposition:  PACU  Steva Ready, DO

## 2022-06-01 ENCOUNTER — Encounter (HOSPITAL_BASED_OUTPATIENT_CLINIC_OR_DEPARTMENT_OTHER): Payer: Self-pay | Admitting: Obstetrics and Gynecology

## 2022-10-16 ENCOUNTER — Other Ambulatory Visit: Payer: Self-pay

## 2022-10-16 ENCOUNTER — Emergency Department (HOSPITAL_COMMUNITY)
Admission: EM | Admit: 2022-10-16 | Discharge: 2022-10-16 | Disposition: A | Discharge status: Home / Self Care | Payer: Medicare Other | Attending: Emergency Medicine | Admitting: Emergency Medicine

## 2022-10-16 ENCOUNTER — Encounter (HOSPITAL_COMMUNITY): Payer: Self-pay | Admitting: Emergency Medicine

## 2022-10-16 ENCOUNTER — Emergency Department (HOSPITAL_COMMUNITY): Payer: Medicare Other

## 2022-10-16 DIAGNOSIS — J069 Acute upper respiratory infection, unspecified: Secondary | ICD-10-CM | POA: Diagnosis not present

## 2022-10-16 DIAGNOSIS — Z1152 Encounter for screening for COVID-19: Secondary | ICD-10-CM | POA: Diagnosis not present

## 2022-10-16 DIAGNOSIS — J4521 Mild intermittent asthma with (acute) exacerbation: Secondary | ICD-10-CM | POA: Insufficient documentation

## 2022-10-16 DIAGNOSIS — I10 Essential (primary) hypertension: Secondary | ICD-10-CM | POA: Insufficient documentation

## 2022-10-16 DIAGNOSIS — R0602 Shortness of breath: Secondary | ICD-10-CM | POA: Diagnosis present

## 2022-10-16 LAB — RESP PANEL BY RT-PCR (RSV, FLU A&B, COVID)  RVPGX2
Influenza A by PCR: NEGATIVE
Influenza B by PCR: NEGATIVE
Resp Syncytial Virus by PCR: NEGATIVE
SARS Coronavirus 2 by RT PCR: NEGATIVE

## 2022-10-16 MED ORDER — PREDNISONE 50 MG PO TABS: 50.0000 mg | ORAL_TABLET | Freq: Every day | ORAL | 0 refills | Status: AC

## 2022-10-16 MED ORDER — ALBUTEROL SULFATE HFA 108 (90 BASE) MCG/ACT IN AERS: 1.0000 | INHALATION_SPRAY | RESPIRATORY_TRACT | Status: DC | PRN

## 2022-10-16 MED ORDER — HYDROCOD POLI-CHLORPHE POLI ER 10-8 MG/5ML PO SUER: 5.0000 mL | Freq: Two times a day (BID) | ORAL | 0 refills | Status: AC | PRN

## 2022-10-16 MED ORDER — ALBUTEROL SULFATE HFA 108 (90 BASE) MCG/ACT IN AERS
INHALATION_SPRAY | RESPIRATORY_TRACT | Status: AC
  Administered 2022-10-16: 2 via RESPIRATORY_TRACT
  Filled 2022-10-16: qty 6.7

## 2022-10-16 MED ORDER — IPRATROPIUM-ALBUTEROL 0.5-2.5 (3) MG/3ML IN SOLN
3.0000 mL | Freq: Once | RESPIRATORY_TRACT | Status: AC
  Administered 2022-10-16: 3 mL via RESPIRATORY_TRACT
  Filled 2022-10-16: qty 3

## 2022-10-16 MED ORDER — KETOROLAC TROMETHAMINE 30 MG/ML IJ SOLN
30.0000 mg | Freq: Once | INTRAMUSCULAR | Status: AC
  Administered 2022-10-16: 30 mg via INTRAMUSCULAR
  Filled 2022-10-16: qty 1

## 2022-10-16 MED ORDER — DEXAMETHASONE SODIUM PHOSPHATE 10 MG/ML IJ SOLN
10.0000 mg | Freq: Once | INTRAMUSCULAR | Status: AC
  Administered 2022-10-16: 10 mg via INTRAMUSCULAR
  Filled 2022-10-16: qty 1

## 2022-10-16 MED ORDER — AEROCHAMBER PLUS FLO-VU LARGE MISC
Status: AC
  Administered 2022-10-16: 1
  Filled 2022-10-16: qty 1

## 2022-10-16 MED ORDER — AEROCHAMBER Z-STAT PLUS/MEDIUM MISC
1.0000 | Freq: Once | Status: AC
  Filled 2022-10-16: qty 1

## 2022-10-16 NOTE — ED Notes (Signed)
Patient reports she has had cold sx and sore throat for the past 5 days.  She reports it feels like her chest and throat are on fire.  She is alert and oriented.  No s/sx of distress at rest.  Lungs are clear on exam.  She reports she is able to tolerate po fluids and solids

## 2022-10-16 NOTE — ED Provider Notes (Signed)
[facility] EMERGENCY DEPARTMENT Provider Note   CSN: [id number] Arrival date & time: 10/16/22  0944     History  Chief Complaint  Patient presents with   Back Pain   Shortness of Breath   Chest Pain   Cough   Fatigue    Christina Cox is a 42 y.o. female.  Pt is a 42 yo female with pmhx significant for bipolar d/o, interstitial cystitis, migraines, anxiety, htn, dub, and kidney stones.  Pt said she's had a cough and sob for the past 4-5 days.  She did see her pcp yesterday who did a rapid flu and covid test and strep swab which were neg.  She was       Home Medications Prior to Admission medications   Medication Sig Start Date End Date Taking? Authorizing Provider  amphetamine-dextroamphetamine (ADDERALL) 20 MG tablet Take 20 mg by mouth 3 (three) times daily.   Yes [provider]  benzonatate (TESSALON) 200 MG capsule Take 200 mg by mouth 3 (three) times daily as needed for cough.   Yes [provider]  BIOTIN PO Take 1 tablet by mouth every evening.   Yes [provider]  chlorpheniramine-HYDROcodone (TUSSIONEX) 10-8 MG/5ML Take 5 mLs by mouth every 12 (twelve) hours as needed for cough. 10/16/22  Yes Isla Pence, MD  Multiple Vitamin (MULTIVITAMIN) tablet Take 1 tablet by mouth every evening.   Yes [provider]  Omega-3 Fatty Acids (FISH OIL PO) Take 1 capsule by mouth every evening.   Yes [provider]  predniSONE (DELTASONE) 50 MG tablet Take 1 tablet (50 mg total) by mouth daily with breakfast. 10/16/22  Yes Isla Pence, MD  topiramate (TOPAMAX) 50 MG tablet Take 50 mg by mouth 2 (two) times daily.   Yes [provider]  oxyCODONE (OXY IR/ROXICODONE) 5 MG immediate release tablet Take 1 tablet (5 mg total) by mouth every 6 (six) hours as needed for severe pain or breakthrough pain. Patient not taking: Reported on 10/16/2022 05/29/22   Drema Dallas, DO      Allergies    Codeine     Review of Systems   Review of Systems  Physical Exam Updated Vital Signs BP 130/64 (BP Location: Left Arm)   Pulse 89   Temp 98.8 F (37.1 C) (Oral)   Resp (!) 22   Ht 5\' 3"  (1.6 m)   Wt 65.8 kg   SpO2 98%   BMI 25.69 kg/m  Physical Exam  ED Results / Procedures / Treatments   Labs (all labs ordered are listed, but only abnormal results are displayed) Labs Reviewed  RESP PANEL BY RT-PCR (RSV, FLU A&B, COVID)  RVPGX2    EKG None  Radiology DG Chest 2 View  Result Date: 10/16/2022 CLINICAL DATA:  Cough.  Shortness of breath. EXAM: CHEST - 2 VIEW COMPARISON:  December 05, 2007 chest x-ray FINDINGS: Haziness symmetrically over the bases consistent with overlapping soft tissues. The heart, hila, and mediastinum are normal. No pneumothorax. No nodules or masses. No focal infiltrates. IMPRESSION: No active cardiopulmonary disease. Electronically Signed   By: Dorise Bullion III M.D.   On: 10/16/2022 10:17    Procedures Procedures    Medications Ordered in ED Medications  albuterol (VENTOLIN HFA) 108 (90 Base) MCG/ACT inhaler 1-2 puff (has no administration in time range)  aerochamber Z-Stat Plus/medium 1 each (has no administration in time range)  dexamethasone (DECADRON) injection 10 mg (10 mg Intramuscular Given 10/16/22 1259)  ketorolac (TORADOL)  30 MG/ML injection 30 mg (30 mg Intramuscular Given 10/16/22 1259)  ipratropium-albuterol (DUONEB) 0.5-2.5 (3) MG/3ML nebulizer solution 3 mL (3 mLs Nebulization Given 10/16/22 1259)    ED Course/ Medical Decision Making/ A&P                           Medical Decision Making Risk Prescription drug management.   This patient presents to the ED for concern of sob, this involves an extensive number of treatment options, and is a complaint that carries with it a high risk of complications and morbidity.  The differential diagnosis includes pna, covid/flu, rsv, bronchitis   Co morbidities that complicate the patient  evaluation  bipolar d/o, interstitial cystitis, migraines, anxiety, htn, dub, and kidney stones   Additional history obtained:  Additional history obtained from epic chart review    Lab Tests:  I Ordered, and personally interpreted labs.  The pertinent results include:  covid/flu/rsv neg   Imaging Studies ordered:  I ordered imaging studies including cxr  I independently visualized and interpreted imaging which showed No active cardiopulmonary disease.  I agree with the radiologist interpretation   Cardiac Monitoring:  The patient was maintained on a cardiac monitor.  I personally viewed and interpreted the cardiac monitored which showed an underlying rhythm of: nsr   Medicines ordered and prescription drug management:  I ordered medication including toradol, duoneb and decadron  for dx  Reevaluation of the patient after these medicines showed that the patient improved I have reviewed the patients home medicines and have made adjustments as needed  Problem List / ED Course:  URI:  sx improved after neb and steroids.  Pt is neg for covid/flu/rsv.  She is oxygenating well.  She is stable for d/c.  Return if worse.    Reevaluation:  After the interventions noted above, I reevaluated the patient and found that they have :improved   Social Determinants of Health:  Lives at home   Dispostion:  After consideration of the diagnostic results and the patients response to treatment, I feel that the patent would benefit from discharge with outpatient f/u.          Final Clinical Impression(s) / ED Diagnoses Final diagnoses:  Viral upper respiratory tract infection  Mild intermittent reactive airway disease with acute exacerbation    Rx / DC Orders ED Discharge Orders          Ordered    predniSONE (DELTASONE) 50 MG tablet  Daily with breakfast        10/16/22 1546    chlorpheniramine-HYDROcodone (TUSSIONEX) 10-8 MG/5ML  Every 12 hours PRN        10/16/22  1546              Isla Pence, MD 10/16/22 1547

## 2022-10-16 NOTE — ED Notes (Signed)
Patient demonstrated and verbalized understanding of discharge instructions and reasons to return to the ED

## 2022-10-16 NOTE — ED Triage Notes (Signed)
Pt. Stated, I went to my Dr. Wilburn Mylar  and all my test were negative. But Im still just coughing coughing all the time. Making my chest be on fire and making me fatigue. This started 4-5 days ago.

## 2022-10-16 NOTE — ED Notes (Signed)
Patient has occassional cough.  States she feels tired.  She is aware of pending test result.

## 2022-10-16 NOTE — ED Notes (Signed)
Patient is resting quietly.  Cough has decreased.  She is waiting on covid swab results prior to discharge

## 2022-10-16 NOTE — ED Provider Triage Note (Signed)
Emergency Medicine Provider Triage Evaluation Note  Christina Cox , a 42 y.o. female  was evaluated in triage.  Pt complains of continued cough with congestion and feeling overall unwell over the last 4-5 days.  Also with shortness of breath and "fire" sensation when coughing at base of throat.  Recent sick contacts, son sick a few days before she developed symptoms.  Denies neck stiffness or difficulty swallowing.  No hx of asthma or COPD.  Tested yesterday at Dr office for covid, flu and RSV -- all negative.  Hx of bipolar, HTN, chronic migraines, asthma and depression.  Hx of gastric sleeve surgery.  Review of Systems  Positive:  Negative: See above  Physical Exam  BP (!) 166/86 (BP Location: Right Arm)   Pulse 81   Temp 98.7 F (37.1 C) (Oral)   Resp 18   Ht 5\' 3"  (1.6 m)   Wt 65.8 kg   SpO2 100%   BMI 25.69 kg/m  Gen:   Awake, no distress   Resp:  Normal effort, CTAB, equal chest rise, adequate air movement MSK:   Moves extremities without difficulty  Other:  Sitting comfortably. Chest TTP, no crepitus, rash, erythema, or bony deformity.    Medical Decision Making  Medically screening exam initiated at 9:56 AM.  Appropriate orders placed.  Christina Cox was informed that the remainder of the evaluation will be completed by another provider, this initial triage assessment does not replace that evaluation, and the importance of remaining in the ED until their evaluation is complete.     Christina Rome, PA-C 15/05/69 1001

## 2022-10-16 NOTE — ED Notes (Signed)
Patient reports she still feels bad  the medication has not decreased her pain.  She continues to have a cough.  Cold drink provided

## 2022-10-21 ENCOUNTER — Other Ambulatory Visit (HOSPITAL_COMMUNITY): Payer: Self-pay

## 2022-12-04 ENCOUNTER — Encounter (HOSPITAL_COMMUNITY): Payer: Self-pay | Admitting: *Deleted

## 2023-04-02 ENCOUNTER — Other Ambulatory Visit: Payer: Self-pay | Admitting: Obstetrics and Gynecology

## 2023-04-02 DIAGNOSIS — Z1231 Encounter for screening mammogram for malignant neoplasm of breast: Secondary | ICD-10-CM

## 2023-05-26 ENCOUNTER — Ambulatory Visit: Payer: 59

## 2023-06-23 ENCOUNTER — Ambulatory Visit
Admission: RE | Admit: 2023-06-23 | Discharge: 2023-06-23 | Disposition: A | Discharge status: Home / Self Care | Payer: 59 | Source: Ambulatory Visit | Attending: Obstetrics and Gynecology

## 2023-06-23 DIAGNOSIS — Z1231 Encounter for screening mammogram for malignant neoplasm of breast: Secondary | ICD-10-CM

## 2023-08-07 IMAGING — CT CT RENAL STONE PROTOCOL
2 of 4 series · 16 of 46 positions shown, 18 images · non-contrast
Comparison: 07/09/2008

CLINICAL DATA: Severe left flank pain for 1 hour

EXAM:
CT ABDOMEN AND PELVIS WITHOUT CONTRAST
TECHNIQUE: Multidetector CT imaging of the abdomen and pelvis was performed
following the standard protocol without IV contrast.

[Series 2: axial st · axial · 0.81mm/px · z∈[-511,-76]mm · 13 of 99 slices shown, 15 images]
[im 6/99  soft-tissue]
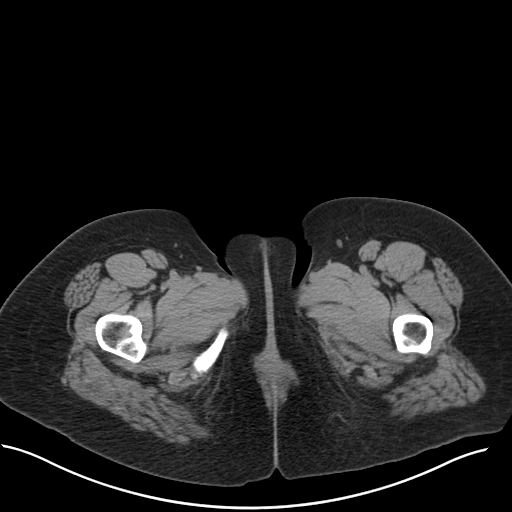
[im 6/99  bone]
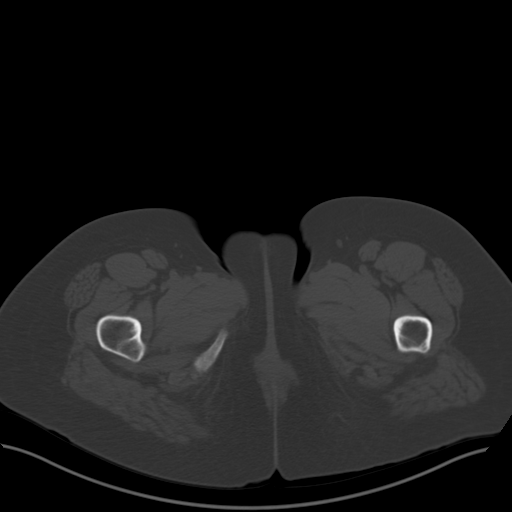
[im 12/99  soft-tissue]
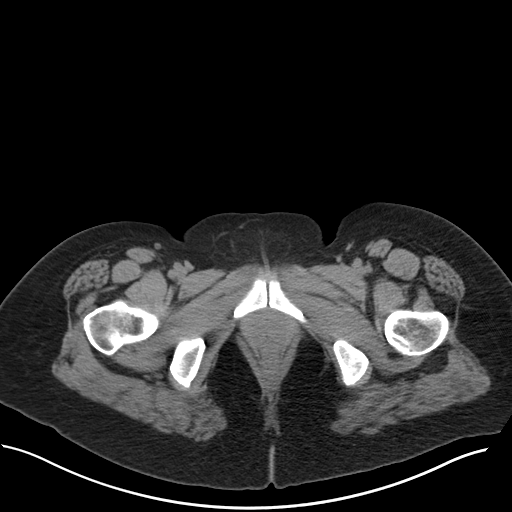
[im 24/99  soft-tissue]
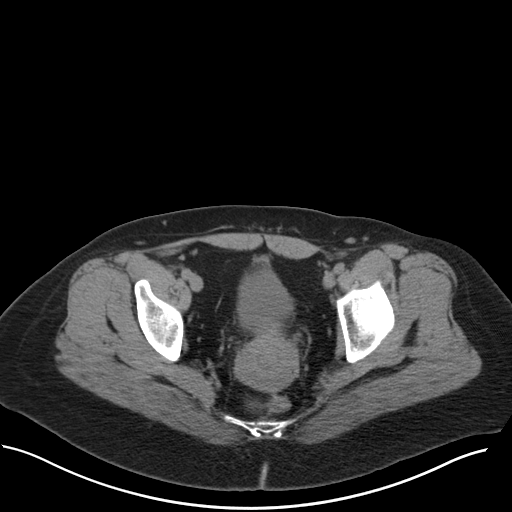
[im 29/99  soft-tissue]
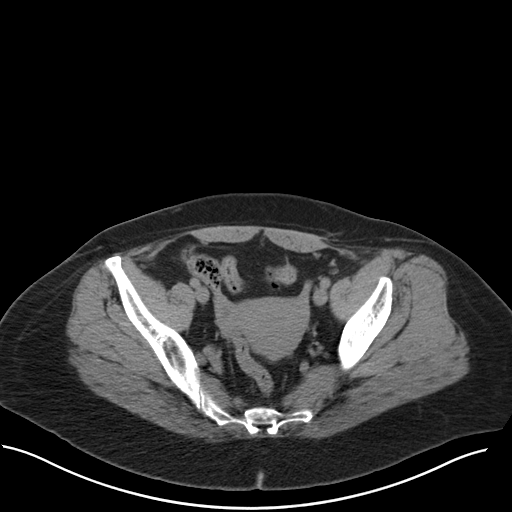
[im 35/99  soft-tissue]
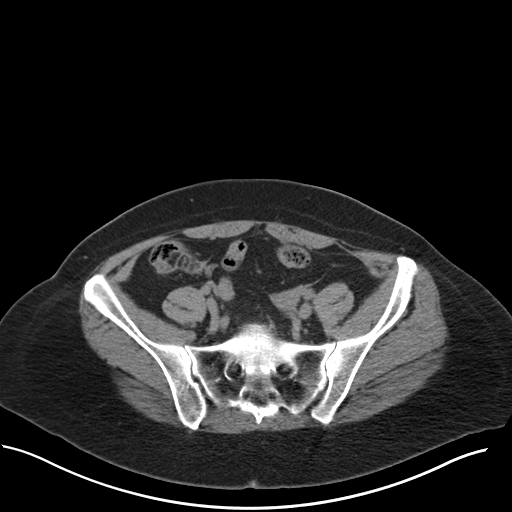
[im 41/99  soft-tissue]
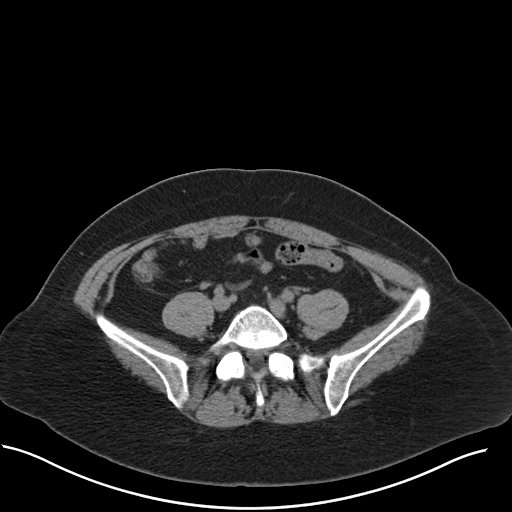
[im 52/99  soft-tissue]
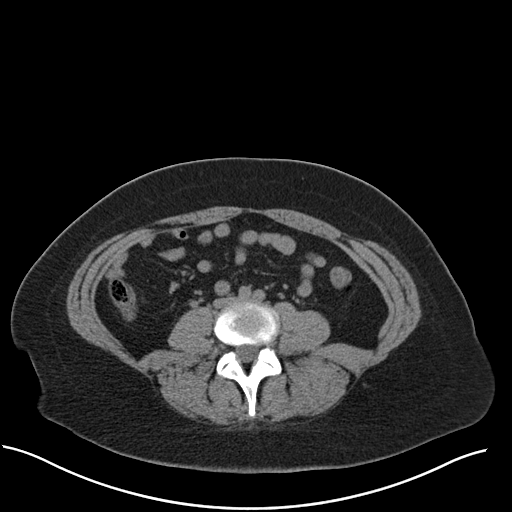
[im 58/99  soft-tissue]
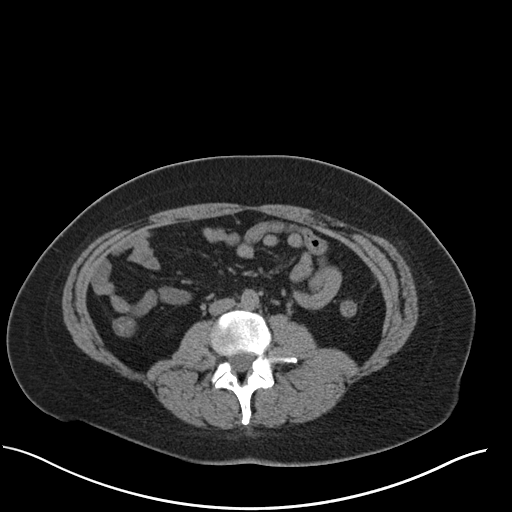
[im 64/99  soft-tissue]
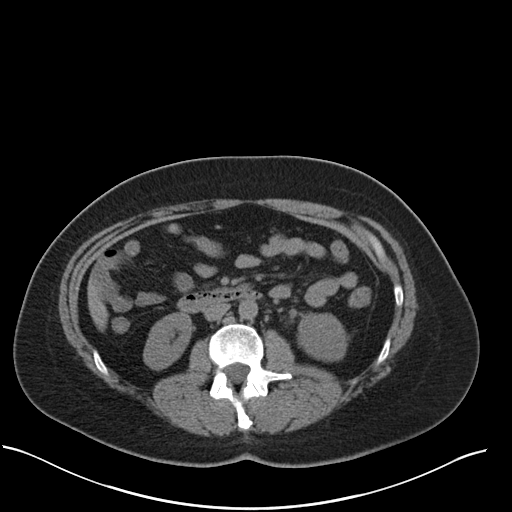
[im 64/99  bone]
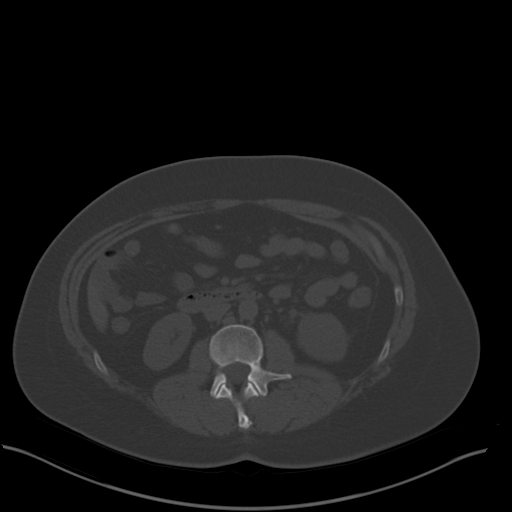
[im 70/99  soft-tissue]
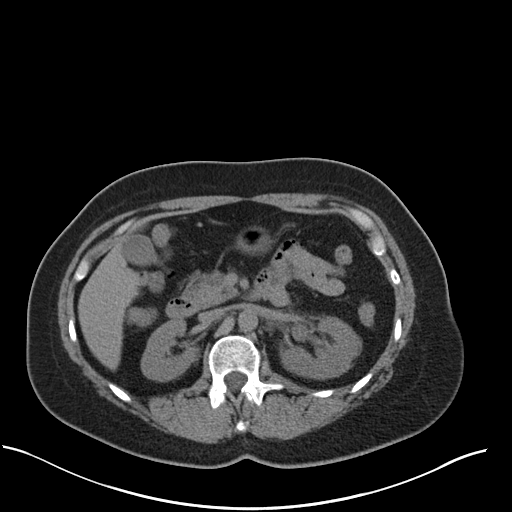
[im 75/99  soft-tissue]
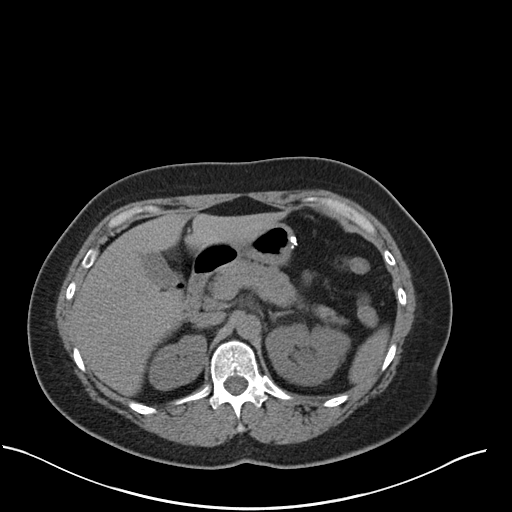
[im 87/99  soft-tissue]
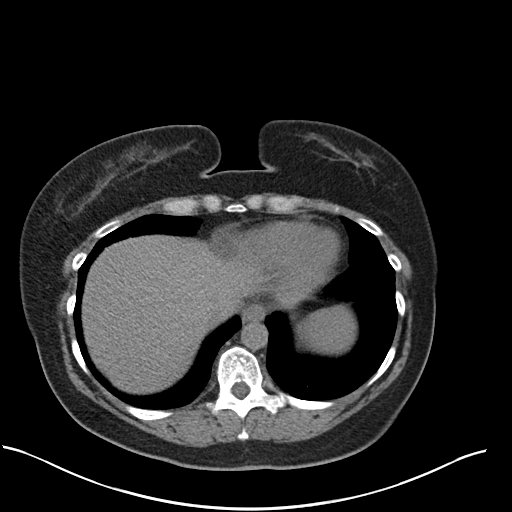
[im 93/99  soft-tissue]
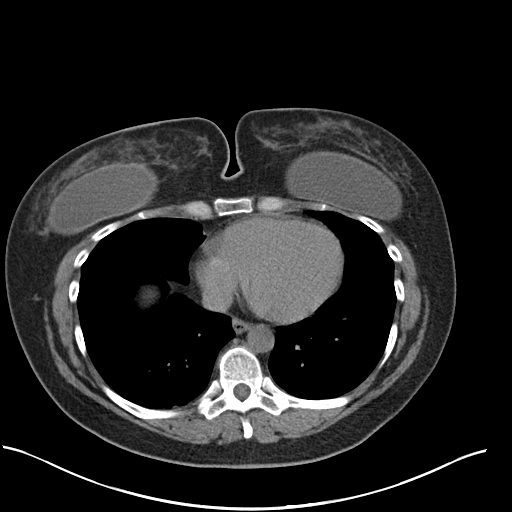

[Series 5: coronal · coronal · 0.84mm/px · 3 of 151 slices shown]
[im 51/151  soft-tissue]
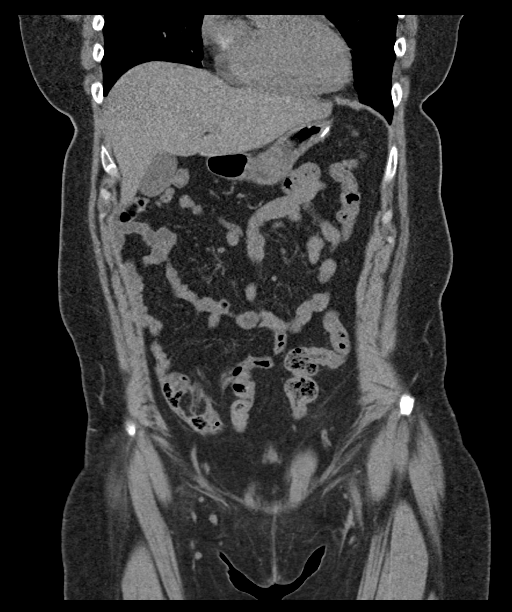
[im 67/151  soft-tissue]
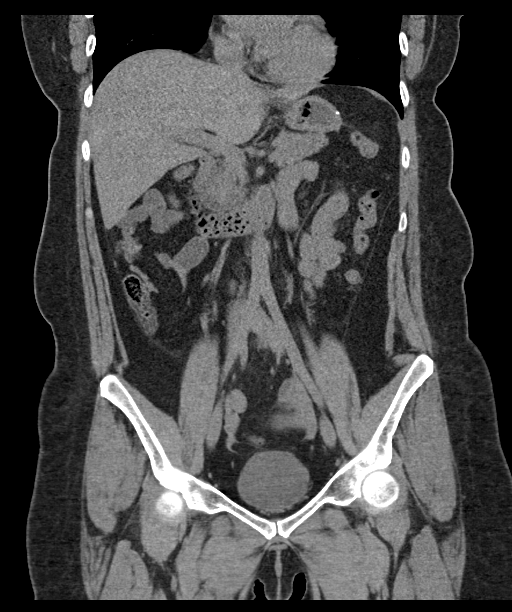
[im 84/151  soft-tissue]
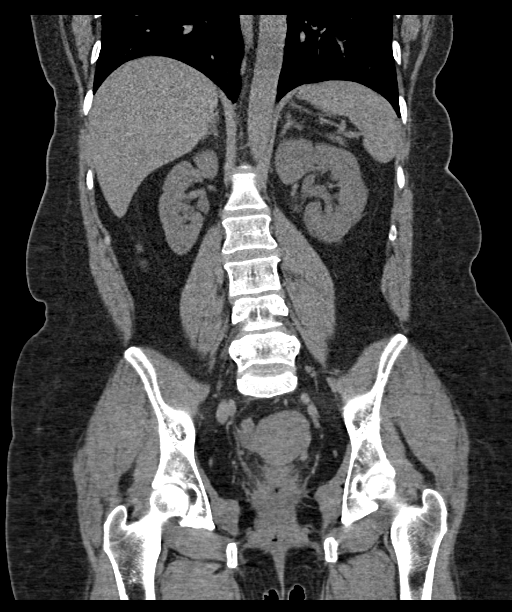

[16 of 46 positions shown; findings below may reference images not displayed]

FINDINGS: Lower chest: No acute pleural or parenchymal lung disease.

Hepatobiliary: No focal liver abnormality is seen. No gallstones,
gallbladder wall thickening, or biliary dilatation.

Pancreas: Unremarkable. No pancreatic ductal dilatation or
surrounding inflammatory changes.

Spleen: Normal in size without focal abnormality.

Adrenals/Urinary Tract: There is mild left-sided obstructive
uropathy due to a 6 mm left UPJ calculus, reference image 37/2.

The right kidney is unremarkable. The adrenals and bladder are
grossly normal.

Stomach/Bowel: No bowel obstruction or ileus. Normal appendix right
lower quadrant. No bowel wall thickening or inflammatory change.
Postsurgical changes from previous bariatric surgery.

Vascular/Lymphatic: No significant vascular findings are present. No
enlarged abdominal or pelvic lymph nodes.

Reproductive: Uterus and bilateral adnexa are unremarkable.

Other: No free fluid or free gas.  No abdominal wall hernia.

Musculoskeletal: No acute or destructive bony lesions. Reconstructed
images demonstrate no additional findings.
IMPRESSION: 1. Mild left-sided obstructive uropathy due to a 6 mm left UPJ
calculus.

## 2023-08-19 ENCOUNTER — Encounter (HOSPITAL_COMMUNITY): Payer: Self-pay

## 2023-08-19 ENCOUNTER — Other Ambulatory Visit: Payer: Self-pay

## 2023-08-19 ENCOUNTER — Emergency Department (HOSPITAL_COMMUNITY)
Admission: EM | Admit: 2023-08-19 | Discharge: 2023-08-19 | Disposition: A | Discharge status: Home / Self Care | Payer: 59 | Attending: Emergency Medicine | Admitting: Emergency Medicine

## 2023-08-19 DIAGNOSIS — I10 Essential (primary) hypertension: Secondary | ICD-10-CM | POA: Insufficient documentation

## 2023-08-19 DIAGNOSIS — Z79899 Other long term (current) drug therapy: Secondary | ICD-10-CM | POA: Insufficient documentation

## 2023-08-19 DIAGNOSIS — D72829 Elevated white blood cell count, unspecified: Secondary | ICD-10-CM | POA: Diagnosis not present

## 2023-08-19 DIAGNOSIS — R42 Dizziness and giddiness: Secondary | ICD-10-CM | POA: Diagnosis present

## 2023-08-19 DIAGNOSIS — R55 Syncope and collapse: Secondary | ICD-10-CM | POA: Diagnosis not present

## 2023-08-19 DIAGNOSIS — R110 Nausea: Secondary | ICD-10-CM | POA: Diagnosis not present

## 2023-08-19 LAB — CBC WITH DIFFERENTIAL/PLATELET
Abs Immature Granulocytes: 0.05 10*3/uL (ref 0.00–0.07)
Basophils Absolute: 0 10*3/uL (ref 0.0–0.1)
Basophils Relative: 0 %
Eosinophils Absolute: 0.1 10*3/uL (ref 0.0–0.5)
Eosinophils Relative: 1 %
HCT: 52.6 % — ABNORMAL HIGH (ref 36.0–46.0)
Hemoglobin: 16.8 g/dL — ABNORMAL HIGH (ref 12.0–15.0)
Immature Granulocytes: 0 %
Lymphocytes Relative: 16 %
Lymphs Abs: 2 10*3/uL (ref 0.7–4.0)
MCH: 29.4 pg (ref 26.0–34.0)
MCHC: 31.9 g/dL (ref 30.0–36.0)
MCV: 92.1 fL (ref 80.0–100.0)
Monocytes Absolute: 0.6 10*3/uL (ref 0.1–1.0)
Monocytes Relative: 5 %
Neutro Abs: 10 10*3/uL — ABNORMAL HIGH (ref 1.7–7.7)
Neutrophils Relative %: 78 %
Platelets: 347 10*3/uL (ref 150–400)
RBC: 5.71 MIL/uL — ABNORMAL HIGH (ref 3.87–5.11)
RDW: 12.4 % (ref 11.5–15.5)
WBC: 12.8 10*3/uL — ABNORMAL HIGH (ref 4.0–10.5)
nRBC: 0 % (ref 0.0–0.2)

## 2023-08-19 LAB — BASIC METABOLIC PANEL
Anion gap: 9 (ref 5–15)
BUN: 11 mg/dL (ref 6–20)
CO2: 21 mmol/L — ABNORMAL LOW (ref 22–32)
Calcium: 7.7 mg/dL — ABNORMAL LOW (ref 8.9–10.3)
Chloride: 110 mmol/L (ref 98–111)
Creatinine, Ser: 0.88 mg/dL (ref 0.44–1.00)
GFR, Estimated: >60 mL/min (ref >60)
Glucose, Bld: 93 mg/dL (ref 70–99)
Potassium: 3.9 mmol/L (ref 3.5–5.1)
Sodium: 140 mmol/L (ref 135–145)

## 2023-08-19 LAB — PREGNANCY, URINE: Preg Test, Ur: NEGATIVE

## 2023-08-19 MED ORDER — ONDANSETRON HCL 4 MG/2ML IJ SOLN
4.0000 mg | Freq: Once | INTRAMUSCULAR | Status: AC
  Administered 2023-08-19: 4 mg via INTRAVENOUS
  Filled 2023-08-19: qty 2

## 2023-08-19 MED ORDER — SODIUM CHLORIDE 0.9 % IV BOLUS
1000.0000 mL | Freq: Once | INTRAVENOUS | Status: AC
  Administered 2023-08-19: 1000 mL via INTRAVENOUS

## 2023-08-19 NOTE — Discharge Instructions (Signed)
You have been seen today for your complaint of light headedness, nausea after donating plasma. Your lab work was reassuring. Home care instructions are as follows:  Drink plenty of water.  Eat a normal diet Follow up with: Your PCP in 1 week for reevaluation Please seek immediate medical care if you develop any of the following symptoms: You faint. You have any of these symptoms that may indicate trouble with your heart: Fast or irregular heartbeats (palpitations). Unusual pain in your chest, abdomen, or back. Shortness of breath. You have a seizure. You have a severe headache. You are confused. You have vision problems. You have severe weakness or trouble walking. You are bleeding from your mouth or rectum, or have black or tarry stool. At this time there does not appear to be the presence of an emergent medical condition, however there is always the potential for conditions to change. Please read and follow the below instructions.  Do not take your medicine if  develop an itchy rash, swelling in your mouth or lips, or difficulty breathing; call 911 and seek immediate emergency medical attention if this occurs.  You may review your lab tests and imaging results in their entirety on your MyChart account.  Please discuss all results of fully with your primary care provider and other specialist at your follow-up visit.  Note: Portions of this text may have been transcribed using voice recognition software. Every effort was made to ensure accuracy; however, inadvertent computerized transcription errors may still be present.

## 2023-08-19 NOTE — ED Provider Notes (Signed)
[facility] EMERGENCY DEPARTMENT AT [facility] Provider Note   CSN: [id number] Arrival date & time: 08/19/23  1326     History  Chief Complaint  Patient presents with   Dizziness   Nausea    Ronnette Askeland is a 42 y.o. female.  With a history of anxiety, hypertension presenting to the ED for evaluation of feeling ill.  She states she donated plasma earlier today.  She ate some eggs and drink a bottle of water prior to donating plasma.  While ordering labs today she began to feel nauseated, weak and sick.  She denies any chest pain or shortness of breath.  She has donated plasma before.  She denies any dizziness.   Dizziness Associated symptoms: nausea        Home Medications Prior to Admission medications   Medication Sig Start Date End Date Taking? Authorizing Provider  amphetamine-dextroamphetamine (ADDERALL) 20 MG tablet Take 20 mg by mouth 3 (three) times daily.    [provider]  benzonatate (TESSALON) 200 MG capsule Take 200 mg by mouth 3 (three) times daily as needed for cough.    [provider]  BIOTIN PO Take 1 tablet by mouth every evening.    [provider]  chlorpheniramine-HYDROcodone (TUSSIONEX) 10-8 MG/5ML Take 5 mLs by mouth every 12 (twelve) hours as needed for cough. 10/16/22   Jacalyn Lefevre, MD  Multiple Vitamin (MULTIVITAMIN) tablet Take 1 tablet by mouth every evening.    [provider]  Omega-3 Fatty Acids (FISH OIL PO) Take 1 capsule by mouth every evening.    [provider]  oxyCODONE (OXY IR/ROXICODONE) 5 MG immediate release tablet Take 1 tablet (5 mg total) by mouth every 6 (six) hours as needed for severe pain or breakthrough pain. Patient not taking: Reported on 10/16/2022 05/29/22   Steva Ready, DO  predniSONE (DELTASONE) 50 MG tablet Take 1 tablet (50 mg total) by mouth daily with breakfast. 10/16/22   Jacalyn Lefevre, MD  topiramate (TOPAMAX) 50 MG tablet Take 50 mg by mouth 2 (two) times  daily.    [provider]      Allergies    Codeine    Review of Systems   Review of Systems  Gastrointestinal:  Positive for nausea.  Neurological:  Positive for light-headedness.  All other systems reviewed and are negative.   Physical Exam Updated Vital Signs BP 92/62   Pulse 82   Temp 98 F (36.7 C) (Oral)   Resp 18   Ht 5\' 3"  (1.6 m)   Wt 75.3 kg   SpO2 99%   BMI 29.41 kg/m  Physical Exam Vitals and nursing note reviewed.  Constitutional:      General: She is not in acute distress.    Appearance: She is well-developed.  HENT:     Head: Normocephalic and atraumatic.  Eyes:     Conjunctiva/sclera: Conjunctivae normal.  Cardiovascular:     Rate and Rhythm: Normal rate and regular rhythm.     Heart sounds: No murmur heard. Pulmonary:     Effort: Pulmonary effort is normal. No respiratory distress.     Breath sounds: Normal breath sounds.  Abdominal:     Palpations: Abdomen is soft.     Tenderness: There is no abdominal tenderness.  Musculoskeletal:        General: No swelling.     Cervical back: Neck supple.  Skin:    General: Skin is warm and dry.     Capillary Refill: Capillary  refill takes less than 2 seconds.  Neurological:     Mental Status: She is alert.  Psychiatric:        Mood and Affect: Mood normal.     ED Results / Procedures / Treatments   Labs (all labs ordered are listed, but only abnormal results are displayed) Labs Reviewed  BASIC METABOLIC PANEL - Abnormal; Notable for the following components:      Result Value   CO2 21 (*)    Calcium 7.7 (*)    All other components within normal limits  CBC WITH DIFFERENTIAL/PLATELET - Abnormal; Notable for the following components:   WBC 12.8 (*)    RBC 5.71 (*)    Hemoglobin 16.8 (*)    HCT 52.6 (*)    Neutro Abs 10.0 (*)    All other components within normal limits  PREGNANCY, URINE    EKG None  Radiology No results found.  Procedures Procedures    Medications  Ordered in ED Medications  sodium chloride 0.9 % bolus 1,000 mL (1,000 mLs Intravenous New Bag/Given 08/19/23 1430)  ondansetron (ZOFRAN) injection 4 mg (4 mg Intravenous Given 08/19/23 1430)    ED Course/ Medical Decision Making/ A&P Clinical Course as of 08/19/23 1654  Thu Aug 19, 2023  1550 Reports improvement in her symptoms after fluids, food and zofran [AS]    Clinical Course User Index [AS] Doriann Zuch, Edsel Petrin, PA-C                                 Medical Decision Making Amount and/or Complexity of Data Reviewed Labs: ordered.  Risk Prescription drug management.  This patient presents to the ED for concern of nausea, lightheadedness, this involves an extensive number of treatment options, and is a complaint that carries with it a high risk of complications and morbidity.  The differential diagnosis of weakness includes but is not limited to neurologic causes (GBS, myasthenia gravis, CVA, MS, ALS, transverse myelitis, spinal cord injury, CVA, botulism, ) and other causes: ACS, Arrhythmia, syncope, orthostatic hypotension, sepsis, hypoglycemia, electrolyte disturbance, hypothyroidism, respiratory failure, symptomatic anemia, dehydration, heat injury, polypharmacy, malignancy.   My initial workup includes basic labs  Additional history obtained from: Nursing notes from this visit. EMS provides a portion of the history  I ordered, reviewed and interpreted labs which include: CBC, BMP, urine pregnancy.  Hemoglobin of 16.8, leukocytosis of 12.8.  Likely hemoconcentration  Afebrile, hemodynamically stable.  42 year old female presenting for evaluation of feeling ill after he donated plasma today.  She has not eaten or drink much prior to donating plasma today.  She had a near syncopal episode while attempting to order lunch afterwards.  Patient appears very well on physical exam.  No acute findings.  Lab workup was reassuring.  Patient reported improvement in her symptoms after IV  fluids, Zofran and food intake.  Overall suspect vasovagal reaction.  She was encouraged to drink plenty of water and eat a normal diet.  She was given return precautions.  Stable at discharge.  At this time there does not appear to be any evidence of an acute emergency medical condition and the patient appears stable for discharge with appropriate outpatient follow up. Diagnosis was discussed with patient who verbalizes understanding of care plan and is agreeable to discharge. I have discussed return precautions with patient who verbalizes understanding. Patient encouraged to follow-up with their PCP within 1 week. All questions answered.  Note: Portions  of this report may have been transcribed using voice recognition software. Every effort was made to ensure accuracy; however, inadvertent computerized transcription errors may still be present.        Final Clinical Impression(s) / ED Diagnoses Final diagnoses:  Near syncope    Rx / DC Orders ED Discharge Orders     None         Michelle Piper, PA-C 08/19/23 1654    Anders Simmonds T, DO 08/20/23 [phone]

## 2023-08-19 NOTE — ED Triage Notes (Signed)
Pt coming in after donating plasma. Pt was ordering her lunch when she became nauseated but did not throw up. Pt reports dizziness. Pt reports not getting very much water today. Pt is pale in triage. And reports feeling diaphoretic   Hr 78 Rr 16  Bp 106 palp  Spo2 98% Cbg 99

## 2023-08-22 IMAGING — DX DG ABDOMEN 1V
2 series · 2 of 2 positions shown · non-contrast
Comparison: 08/01/2020, CT 07/23/2021

CLINICAL DATA: Left-sided kidney stone

EXAM:
ABDOMEN - 1 VIEW

[abdomen kub (1 of 2)]
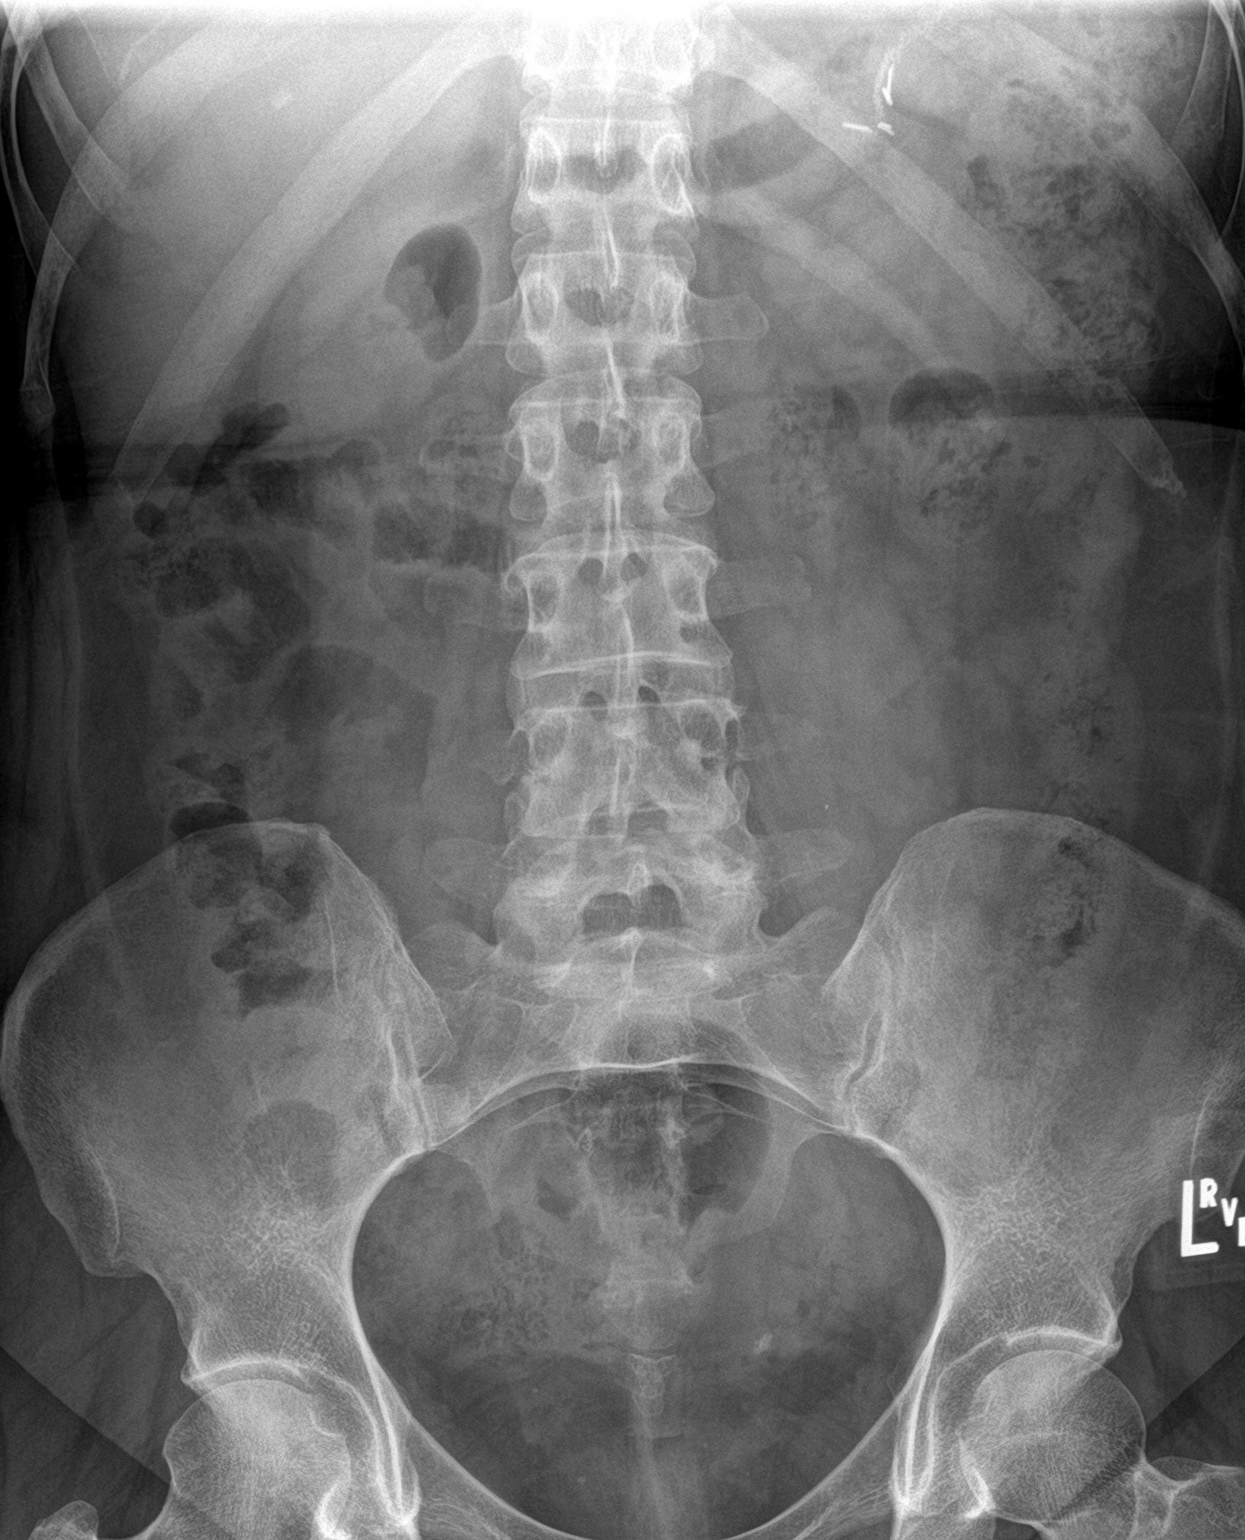

[abdomen kub (2 of 2)]
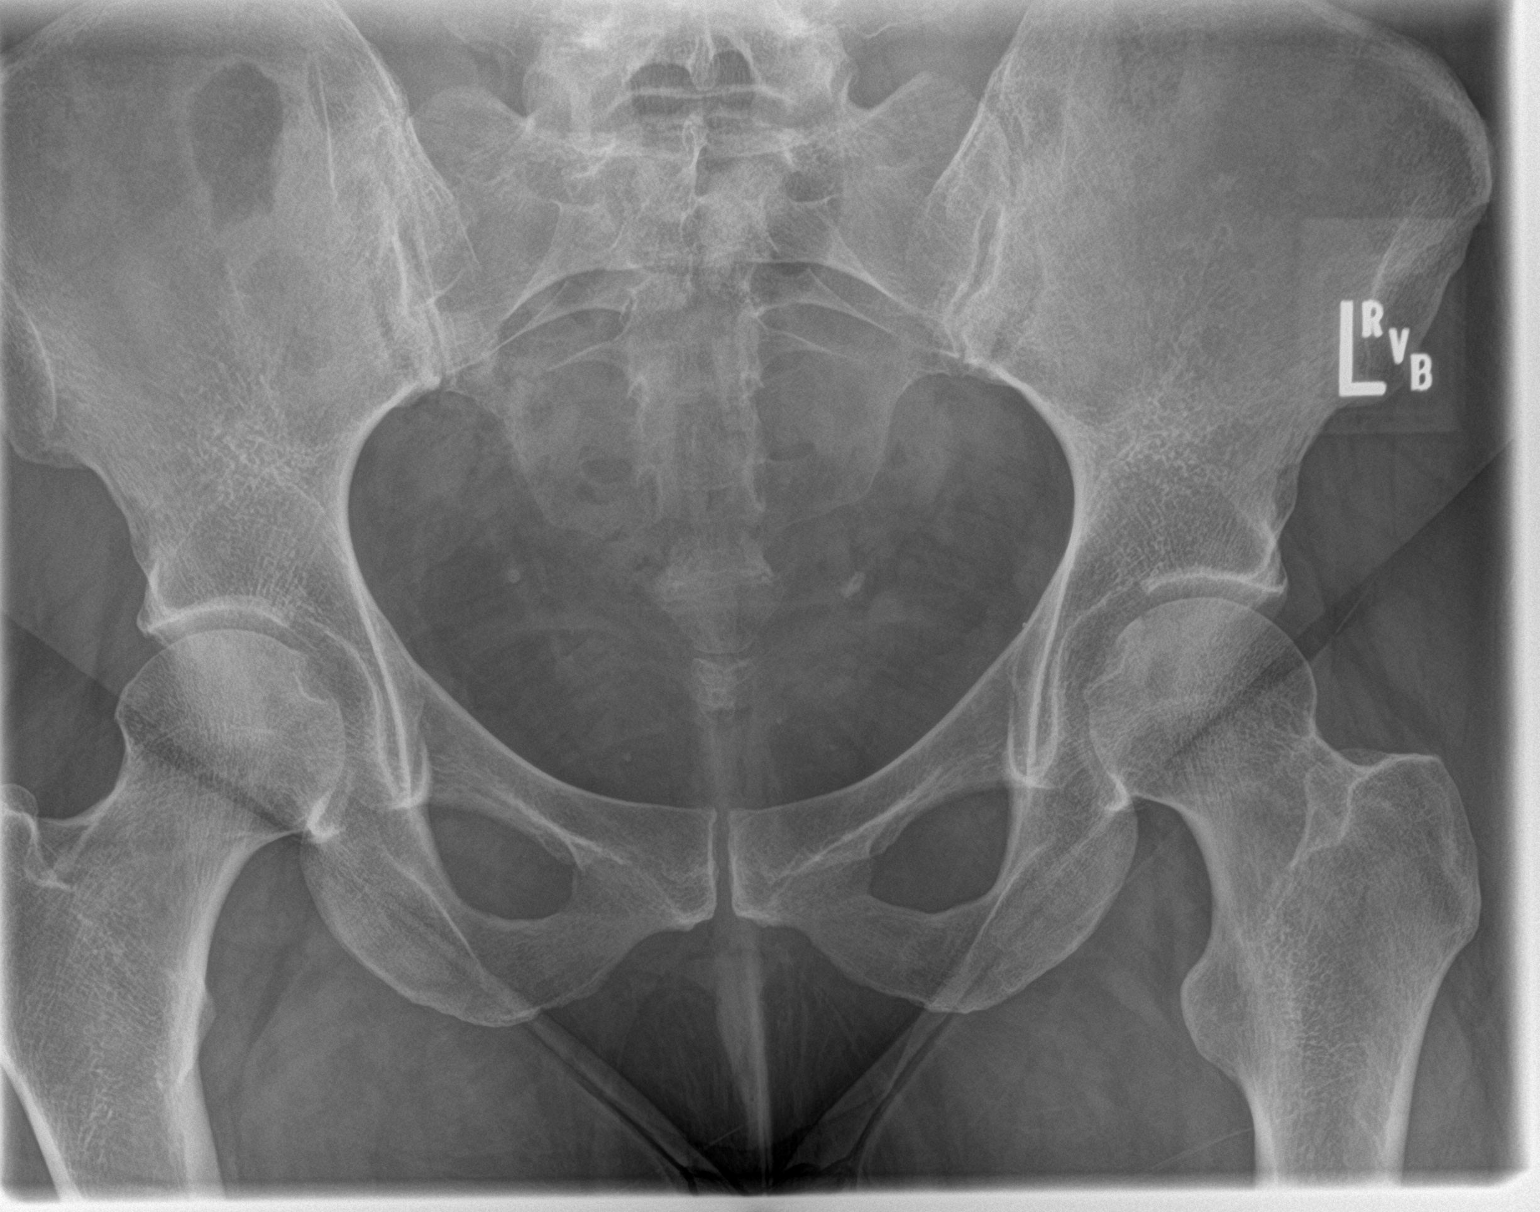

[2 of 2 positions shown; findings below may reference images not displayed]

FINDINGS: Nonobstructed gas pattern with moderate stool. 4 mm calcification
now seen in left pelvis consistent with distal migration of ureteral
stone.
IMPRESSION: 4 mm calcification now visible in the left pelvis consistent with
migration of ureteral stone.

## 2023-12-16 ENCOUNTER — Encounter (HOSPITAL_COMMUNITY): Payer: Self-pay | Admitting: *Deleted

## 2024-08-25 ENCOUNTER — Other Ambulatory Visit (HOSPITAL_COMMUNITY): Payer: Self-pay
